# Patient Record
Sex: Female | Born: 1995 | Race: White | Hispanic: No | Marital: Married | Smoking: Never smoker
Health system: Southern US, Community
[De-identification: ages and names within clinical notes are randomized; demographics above are authoritative.]

## PROBLEM LIST (undated history)

## (undated) DIAGNOSIS — Z8489 Family history of other specified conditions: Secondary | ICD-10-CM

## (undated) DIAGNOSIS — F419 Anxiety disorder, unspecified: Secondary | ICD-10-CM

## (undated) DIAGNOSIS — R05 Cough: Secondary | ICD-10-CM

## (undated) DIAGNOSIS — E119 Type 2 diabetes mellitus without complications: Secondary | ICD-10-CM

## (undated) DIAGNOSIS — S86819A Strain of other muscle(s) and tendon(s) at lower leg level, unspecified leg, initial encounter: Secondary | ICD-10-CM

## (undated) HISTORY — DX: Family history of other specified conditions: Z84.89

## (undated) HISTORY — PX: RHINOPLASTY: SUR1284

---
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
## 2013-01-24 ENCOUNTER — Emergency Department (HOSPITAL_BASED_OUTPATIENT_CLINIC_OR_DEPARTMENT_OTHER): Payer: BC Managed Care – PPO

## 2013-01-24 ENCOUNTER — Emergency Department (HOSPITAL_BASED_OUTPATIENT_CLINIC_OR_DEPARTMENT_OTHER)
Admission: EM | Admit: 2013-01-24 | Discharge: 2013-01-24 | Disposition: A | Discharge status: Home / Self Care | Payer: BC Managed Care – PPO | Attending: Emergency Medicine | Admitting: Emergency Medicine

## 2013-01-24 ENCOUNTER — Encounter (HOSPITAL_BASED_OUTPATIENT_CLINIC_OR_DEPARTMENT_OTHER): Payer: Self-pay | Admitting: Emergency Medicine

## 2013-01-24 DIAGNOSIS — Y9389 Activity, other specified: Secondary | ICD-10-CM | POA: Insufficient documentation

## 2013-01-24 DIAGNOSIS — S63509A Unspecified sprain of unspecified wrist, initial encounter: Secondary | ICD-10-CM | POA: Insufficient documentation

## 2013-01-24 DIAGNOSIS — F411 Generalized anxiety disorder: Secondary | ICD-10-CM | POA: Insufficient documentation

## 2013-01-24 DIAGNOSIS — Y92410 Unspecified street and highway as the place of occurrence of the external cause: Secondary | ICD-10-CM | POA: Insufficient documentation

## 2013-01-24 DIAGNOSIS — S63502A Unspecified sprain of left wrist, initial encounter: Secondary | ICD-10-CM

## 2013-01-24 DIAGNOSIS — Z79899 Other long term (current) drug therapy: Secondary | ICD-10-CM | POA: Insufficient documentation

## 2013-01-24 DIAGNOSIS — IMO0002 Reserved for concepts with insufficient information to code with codable children: Secondary | ICD-10-CM | POA: Insufficient documentation
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
## 2017-01-03 ENCOUNTER — Other Ambulatory Visit: Payer: Self-pay | Admitting: Obstetrics and Gynecology

## 2017-01-03 DIAGNOSIS — N6311 Unspecified lump in the right breast, upper outer quadrant: Secondary | ICD-10-CM

## 2017-01-04 ENCOUNTER — Ambulatory Visit
Admission: RE | Admit: 2017-01-04 | Discharge: 2017-01-04 | Disposition: A | Discharge status: Home / Self Care | Payer: BLUE CROSS/BLUE SHIELD | Source: Ambulatory Visit | Attending: Obstetrics and Gynecology | Admitting: Obstetrics and Gynecology

## 2017-01-04 ENCOUNTER — Other Ambulatory Visit: Payer: Self-pay | Admitting: Obstetrics and Gynecology

## 2017-01-04 DIAGNOSIS — N6311 Unspecified lump in the right breast, upper outer quadrant: Secondary | ICD-10-CM

## 2017-07-04 ENCOUNTER — Ambulatory Visit
Admission: RE | Admit: 2017-07-04 | Discharge: 2017-07-04 | Disposition: A | Discharge status: Home / Self Care | Payer: BLUE CROSS/BLUE SHIELD | Source: Ambulatory Visit | Attending: Obstetrics and Gynecology | Admitting: Obstetrics and Gynecology

## 2017-07-04 ENCOUNTER — Other Ambulatory Visit: Payer: Self-pay | Admitting: Obstetrics and Gynecology

## 2017-07-04 DIAGNOSIS — N6311 Unspecified lump in the right breast, upper outer quadrant: Secondary | ICD-10-CM

## 2017-07-04 DIAGNOSIS — N6310 Unspecified lump in the right breast, unspecified quadrant: Secondary | ICD-10-CM

## 2018-01-05 ENCOUNTER — Other Ambulatory Visit: Payer: BLUE CROSS/BLUE SHIELD

## 2018-01-30 ENCOUNTER — Inpatient Hospital Stay: Admission: RE | Admit: 2018-01-30 | Payer: BLUE CROSS/BLUE SHIELD | Source: Ambulatory Visit

## 2018-02-07 ENCOUNTER — Ambulatory Visit
Admission: RE | Admit: 2018-02-07 | Discharge: 2018-02-07 | Disposition: A | Discharge status: Home / Self Care | Payer: BLUE CROSS/BLUE SHIELD | Source: Ambulatory Visit | Attending: Obstetrics and Gynecology | Admitting: Obstetrics and Gynecology

## 2018-02-07 DIAGNOSIS — N6310 Unspecified lump in the right breast, unspecified quadrant: Secondary | ICD-10-CM

## 2018-03-01 DIAGNOSIS — O139 Gestational [pregnancy-induced] hypertension without significant proteinuria, unspecified trimester: Secondary | ICD-10-CM

## 2018-03-01 HISTORY — DX: Gestational (pregnancy-induced) hypertension without significant proteinuria, unspecified trimester: O13.9

## 2018-03-01 NOTE — L&D Delivery Note (Signed)
Operative Delivery Note At 3:50 PM a viable female was delivered via Vaginal, [facility] Vacuum Neurosurgeon).  Presentation: vertex; Position: Left,, Occiput,, Anterior; Station: +3.  Head delivered after 3 push/pull efforts with each of five contractions; movement noted with each effort.  One pop off.  Verbal consent: obtained from patient.  Risks and benefits discussed in detail.  Risks include, but are not limited to the risks of anesthesia, bleeding, infection, damage to maternal tissues, fetal cephalhematoma.  There is also the risk of inability to effect vaginal delivery of the head, or shoulder dystocia that cannot be resolved by established maneuvers, leading to the need for emergency cesarean section.  APGAR: 9, 9; weight pending.   Placenta status: S, I.   3V Cord with the following complications: none.  Cord pH: n/a  Anesthesia:  CLEA Instruments: Kiwi Episiotomy: None Lacerations: 2nd degree Suture Repair: 3.0 vicryl rapide Est. Blood Loss (mL):  400  Mom to postpartum.  Baby to Couplet care / Skin to Skin.  Linda Hedges 11/24/2018, 4:15 PM

## 2018-03-23 ENCOUNTER — Emergency Department (HOSPITAL_BASED_OUTPATIENT_CLINIC_OR_DEPARTMENT_OTHER)
Admission: EM | Admit: 2018-03-23 | Discharge: 2018-03-24 | Disposition: A | Discharge status: Home / Self Care | Payer: BLUE CROSS/BLUE SHIELD | Attending: Emergency Medicine | Admitting: Emergency Medicine

## 2018-03-23 ENCOUNTER — Other Ambulatory Visit: Payer: Self-pay

## 2018-03-23 ENCOUNTER — Encounter (HOSPITAL_BASED_OUTPATIENT_CLINIC_OR_DEPARTMENT_OTHER): Payer: Self-pay | Admitting: *Deleted

## 2018-03-23 DIAGNOSIS — K529 Noninfective gastroenteritis and colitis, unspecified: Secondary | ICD-10-CM | POA: Diagnosis not present

## 2018-03-23 DIAGNOSIS — O219 Vomiting of pregnancy, unspecified: Secondary | ICD-10-CM | POA: Diagnosis present

## 2018-03-23 DIAGNOSIS — O99611 Diseases of the digestive system complicating pregnancy, first trimester: Secondary | ICD-10-CM | POA: Diagnosis not present

## 2018-03-23 DIAGNOSIS — Z79899 Other long term (current) drug therapy: Secondary | ICD-10-CM | POA: Diagnosis not present

## 2018-03-23 DIAGNOSIS — Z3A01 Less than 8 weeks gestation of pregnancy: Secondary | ICD-10-CM | POA: Insufficient documentation

## 2018-03-23 LAB — URINALYSIS, ROUTINE W REFLEX MICROSCOPIC
BILIRUBIN URINE: NEGATIVE
Glucose, UA: NEGATIVE mg/dL
Hgb urine dipstick: NEGATIVE
Leukocytes, UA: NEGATIVE
NITRITE: NEGATIVE
PROTEIN: NEGATIVE mg/dL
SPECIFIC GRAVITY, URINE: 1.02 (ref 1.005–1.030)
pH: 6 (ref 5.0–8.0)

## 2018-03-23 NOTE — ED Triage Notes (Signed)
She is [redacted] weeks pregnant and having vomiting since 5pm. Lower back and abdominal pain. She has not had an [place] to verify intrauterine placement.

## 2018-03-23 NOTE — ED Notes (Signed)
[place] left xray at 10pm.

## 2018-03-24 LAB — COMPREHENSIVE METABOLIC PANEL
ALBUMIN: 4.5 g/dL (ref 3.5–5.0)
ALK PHOS: 52 U/L (ref 38–126)
ALT: 20 U/L (ref 0–44)
AST: 21 U/L (ref 15–41)
Anion gap: 10 (ref 5–15)
BUN: 21 mg/dL — AB (ref 6–20)
CALCIUM: 8.9 mg/dL (ref 8.9–10.3)
CHLORIDE: 101 mmol/L (ref 98–111)
CO2: 21 mmol/L — AB (ref 22–32)
CREATININE: 0.56 mg/dL (ref 0.44–1.00)
GFR calc Af Amer: >60 mL/min (ref >60)
GFR calc non Af Amer: >60 mL/min (ref >60)
GLUCOSE: 128 mg/dL — AB (ref 70–99)
Potassium: 3.6 mmol/L (ref 3.5–5.1)
SODIUM: 132 mmol/L — AB (ref 135–145)
Total Bilirubin: 1 mg/dL (ref 0.3–1.2)
Total Protein: 7.7 g/dL (ref 6.5–8.1)

## 2018-03-24 LAB — CBC WITH DIFFERENTIAL/PLATELET
Abs Immature Granulocytes: 0.04 10*3/uL (ref 0.00–0.07)
BASOS ABS: 0 10*3/uL (ref 0.0–0.1)
BASOS PCT: 0 %
EOS ABS: 0 10*3/uL (ref 0.0–0.5)
Eosinophils Relative: 0 %
HCT: 39.4 % (ref 36.0–46.0)
HEMOGLOBIN: 12.8 g/dL (ref 12.0–15.0)
Immature Granulocytes: 0 %
LYMPHS ABS: 0.7 10*3/uL (ref 0.7–4.0)
LYMPHS PCT: 5 %
MCH: 30.5 pg (ref 26.0–34.0)
MCHC: 32.5 g/dL (ref 30.0–36.0)
MCV: 94 fL (ref 80.0–100.0)
MONO ABS: 0.3 10*3/uL (ref 0.1–1.0)
Monocytes Relative: 2 %
NEUTROS ABS: 12.5 10*3/uL — AB (ref 1.7–7.7)
Neutrophils Relative %: 93 %
PLATELETS: 203 10*3/uL (ref 150–400)
RBC: 4.19 MIL/uL (ref 3.87–5.11)
RDW: 11.6 % (ref 11.5–15.5)
WBC: 13.5 10*3/uL — ABNORMAL HIGH (ref 4.0–10.5)
nRBC: 0 % (ref 0.0–0.2)

## 2018-03-24 LAB — LIPASE, BLOOD: Lipase: 27 U/L (ref 11–51)

## 2018-03-24 LAB — HCG, QUANTITATIVE, PREGNANCY: HCG, BETA CHAIN, QUANT, S: 2411 m[IU]/mL — AB (ref <5)

## 2018-03-24 LAB — ABO/RH: ABO/RH(D): O POS

## 2018-03-24 MED ORDER — METOCLOPRAMIDE HCL 5 MG/ML IJ SOLN
10.0000 mg | Freq: Once | INTRAMUSCULAR | Status: AC
  Administered 2018-03-24: 10 mg via INTRAVENOUS
  Filled 2018-03-24: qty 2

## 2018-03-24 MED ORDER — SODIUM CHLORIDE 0.9 % IV BOLUS
1000.0000 mL | Freq: Once | INTRAVENOUS | Status: AC
  Administered 2018-03-24: 1000 mL via INTRAVENOUS

## 2018-03-24 MED ORDER — METOCLOPRAMIDE HCL 10 MG PO TABS: 10.0000 mg | ORAL_TABLET | Freq: Four times a day (QID) | ORAL | 0 refills | Status: DC | PRN

## 2018-03-24 NOTE — ED Notes (Signed)
Pt tolerating ginger ale 

## 2018-03-24 NOTE — ED Provider Notes (Signed)
[facility] Provider Note: Ashley DellJ. Lane Rucker Pridgeon, MD, FACEP  CSN: [id number] MRN: [id number] ARRIVAL: 03/23/18 at 2212 ROOM: MH01/MH01   CHIEF COMPLAINT  Vomiting   HISTORY OF PRESENT ILLNESS  03/24/18 12:33 AM Ashley Carson is a 23 y.o. female who is about [redacted] weeks pregnant.  She has had nausea, vomiting since about 5 PM.  The vomiting is been more severe than the diarrhea.  She has had some generalized abdominal cramping which is not severe.  She is having some lower back pain as well.  She has tried drinking Gatorade without success.   Past Medical History:  Diagnosis Date  . Anxiety   . Cough 04/23/2011  . Patellar tendon rupture 04/2011   right    Past Surgical History:  Procedure Laterality Date  . KNEE ARTHROSCOPY  07/09/2010   right; open repair patella dislocation  . MEDIAL PATELLOFEMORAL LIGAMENT REPAIR  04/29/2011   Procedure: MEDIAL PATELLA FEMORAL LIGAMENT RECONSTRUCTION;  Surgeon: Loreta Aveaniel F Murphy, MD;  Location: [facility];  Service: Orthopedics;  Laterality: Right;  right knee arthroscopy with chondroplasty open medial patella femoral ligament repair    Family History  Problem Relation Age of Onset  . Hypertension Maternal Grandmother   . Anesthesia problems Mother        post-op N/V/syncope    Social History   Tobacco Use  . Smoking status: Never Smoker  . Smokeless tobacco: Never Used  Substance Use Topics  . Alcohol use: No  . Drug use: No    Prior to Admission medications   Medication Sig Start Date End Date Taking? Authorizing Provider  escitalopram (LEXAPRO) 10 MG tablet Take 10 mg by mouth daily.   Yes [provider]  metoCLOPramide (REGLAN) 10 MG tablet Take 1 tablet (10 mg total) by mouth every 6 (six) hours as needed for nausea or vomiting. 03/24/18   Dara Camargo, Jonny RuizJohn, MD    Allergies Patient has no known allergies.   REVIEW OF SYSTEMS  Negative except as noted here or in the History of Present  Illness.   PHYSICAL EXAMINATION  Initial Vital Signs Blood pressure 112/71, pulse 96, temperature 98.2 F (36.8 C), temperature source Oral, resp. rate 20, height 5\' 7"  (1.702 m), weight 64 kg, last menstrual period 02/18/2018, SpO2 98 %.  Examination General: Well-developed, well-nourished female in no acute distress; appearance consistent with age of record HENT: normocephalic; atraumatic Eyes: pupils equal, round and reactive to light; extraocular muscles intact Neck: supple Heart: regular rate and rhythm Lungs: clear to auscultation bilaterally Abdomen: soft; nondistended; mild diffuse tenderness; no masses or hepatosplenomegaly; bowel sounds present Extremities: No deformity; full range of motion; pulses normal Neurologic: Awake, alert and oriented; motor function intact in all extremities and symmetric; no facial droop Skin: Warm and dry Psychiatric: Normal mood and affect   RESULTS  Summary of this visit's results, reviewed by myself:   EKG Interpretation  Date/Time:    Ventricular Rate:    PR Interval:    QRS Duration:   QT Interval:    QTC Calculation:   R Axis:     Text Interpretation:        Laboratory Studies: Results for orders placed or performed during the hospital encounter of 03/23/18 (from the past 24 hour(s))  Urinalysis, Routine w reflex microscopic     Status: Abnormal   Collection Time: 03/23/18 10:25 PM  Result Value Ref Range   Color, Urine YELLOW YELLOW   APPearance CLEAR CLEAR   Specific  Gravity, Urine 1.020 1.005 - 1.030   pH 6.0 5.0 - 8.0   Glucose, UA NEGATIVE NEGATIVE mg/dL   Hgb urine dipstick NEGATIVE NEGATIVE   Bilirubin Urine NEGATIVE NEGATIVE   Ketones, ur >80 (A) NEGATIVE mg/dL   Protein, ur NEGATIVE NEGATIVE mg/dL   Nitrite NEGATIVE NEGATIVE   Leukocytes, UA NEGATIVE NEGATIVE  hCG, quantitative, pregnancy     Status: Abnormal   Collection Time: 03/23/18 11:57 PM  Result Value Ref Range   hCG, Beta Chain, Quant, S 2,411 (H)  <5 mIU/mL  Comprehensive metabolic panel     Status: Abnormal   Collection Time: 03/23/18 11:57 PM  Result Value Ref Range   Sodium 132 (L) 135 - 145 mmol/L   Potassium 3.6 3.5 - 5.1 mmol/L   Chloride 101 98 - 111 mmol/L   CO2 21 (L) 22 - 32 mmol/L   Glucose, Bld 128 (H) 70 - 99 mg/dL   BUN 21 (H) 6 - 20 mg/dL   Creatinine, Ser 9.37 0.44 - 1.00 mg/dL   Calcium 8.9 8.9 - 16.9 mg/dL   Total Protein 7.7 6.5 - 8.1 g/dL   Albumin 4.5 3.5 - 5.0 g/dL   AST 21 15 - 41 U/L   ALT 20 0 - 44 U/L   Alkaline Phosphatase 52 38 - 126 U/L   Total Bilirubin 1.0 0.3 - 1.2 mg/dL   GFR calc non Af Amer >60 >60 mL/min   GFR calc Af Amer >60 >60 mL/min   Anion gap 10 5 - 15  Lipase, blood     Status: None   Collection Time: 03/23/18 11:57 PM  Result Value Ref Range   Lipase 27 11 - 51 U/L  CBC with Differential     Status: Abnormal   Collection Time: 03/23/18 11:57 PM  Result Value Ref Range   WBC 13.5 (H) 4.0 - 10.5 K/uL   RBC 4.19 3.87 - 5.11 MIL/uL   Hemoglobin 12.8 12.0 - 15.0 g/dL   HCT 67.8 93.8 - 10.1 %   MCV 94.0 80.0 - 100.0 fL   MCH 30.5 26.0 - 34.0 pg   MCHC 32.5 30.0 - 36.0 g/dL   RDW 75.1 02.5 - 85.2 %   Platelets 203 150 - 400 K/uL   nRBC 0.0 0.0 - 0.2 %   Neutrophils Relative % 93 %   Neutro Abs 12.5 (H) 1.7 - 7.7 K/uL   Lymphocytes Relative 5 %   Lymphs Abs 0.7 0.7 - 4.0 K/uL   Monocytes Relative 2 %   Monocytes Absolute 0.3 0.1 - 1.0 K/uL   Eosinophils Relative 0 %   Eosinophils Absolute 0.0 0.0 - 0.5 K/uL   Basophils Relative 0 %   Basophils Absolute 0.0 0.0 - 0.1 K/uL   Immature Granulocytes 0 %   Abs Immature Granulocytes 0.04 0.00 - 0.07 K/uL  ABO/Rh     Status: None   Collection Time: 03/23/18 11:57 PM  Result Value Ref Range   ABO/RH(D) O POS    No rh immune globuloin      NOT A RH IMMUNE GLOBULIN CANDIDATE, PT RH POSITIVE Performed at [facility], [address], [place], [place] [address]    Imaging Studies: No results found.  ED COURSE and MDM   Nursing notes and initial vitals signs, including pulse oximetry, reviewed.  Vitals:   03/23/18 2221 03/23/18 2222 03/24/18 0044  BP:  112/71 109/71  Pulse:  96 79  Resp:  20 18  Temp:  98.2 F (36.8 C)   TempSrc:  Oral   SpO2:  98% 100%  Weight: 64 kg    Height: 5\' 7"  (1.702 m)     2:43 AM Patient feeling better after IV fluids and Reglan.  She is drinking fluids without emesis.  PROCEDURES    ED DIAGNOSES     ICD-10-CM   1. Gastroenteritis K52.9        Nyashia Raney, MD 03/24/18 [phone]

## 2018-04-19 LAB — OB RESULTS CONSOLE GC/CHLAMYDIA
Chlamydia: NEGATIVE
Gonorrhea: NEGATIVE

## 2018-04-19 LAB — OB RESULTS CONSOLE HIV ANTIBODY (ROUTINE TESTING): HIV: NONREACTIVE

## 2018-04-19 LAB — OB RESULTS CONSOLE RPR: RPR: NONREACTIVE

## 2018-04-19 LAB — OB RESULTS CONSOLE ABO/RH: RH Type: POSITIVE

## 2018-04-19 LAB — OB RESULTS CONSOLE HEPATITIS B SURFACE ANTIGEN: Hepatitis B Surface Ag: NEGATIVE

## 2018-04-19 LAB — OB RESULTS CONSOLE ANTIBODY SCREEN: Antibody Screen: NEGATIVE

## 2018-04-19 LAB — OB RESULTS CONSOLE RUBELLA ANTIBODY, IGM: Rubella: IMMUNE

## 2018-11-20 ENCOUNTER — Telehealth (HOSPITAL_COMMUNITY): Payer: Self-pay | Admitting: *Deleted

## 2018-11-20 ENCOUNTER — Encounter (HOSPITAL_COMMUNITY): Payer: Self-pay | Admitting: *Deleted

## 2018-11-20 NOTE — Telephone Encounter (Signed)
Preadmission screen  

## 2018-11-21 ENCOUNTER — Encounter (HOSPITAL_COMMUNITY): Payer: Self-pay | Admitting: *Deleted

## 2018-11-21 LAB — OB RESULTS CONSOLE GBS: GBS: POSITIVE

## 2018-11-22 ENCOUNTER — Other Ambulatory Visit (HOSPITAL_COMMUNITY)
Admission: RE | Admit: 2018-11-22 | Discharge: 2018-11-22 | Disposition: A | Discharge status: Home / Self Care | Payer: BC Managed Care – PPO | Source: Ambulatory Visit | Attending: Obstetrics and Gynecology | Admitting: Obstetrics and Gynecology

## 2018-11-22 ENCOUNTER — Other Ambulatory Visit: Payer: Self-pay

## 2018-11-22 DIAGNOSIS — Z01812 Encounter for preprocedural laboratory examination: Secondary | ICD-10-CM | POA: Insufficient documentation

## 2018-11-22 DIAGNOSIS — Z20828 Contact with and (suspected) exposure to other viral communicable diseases: Secondary | ICD-10-CM | POA: Insufficient documentation

## 2018-11-22 LAB — SARS CORONAVIRUS 2 (TAT 6-24 HRS): SARS Coronavirus 2: NEGATIVE

## 2018-11-22 NOTE — MAU Note (Signed)
Asymptomatic, swab collected. 

## 2018-11-24 ENCOUNTER — Inpatient Hospital Stay (HOSPITAL_COMMUNITY): Payer: BC Managed Care – PPO | Admitting: Anesthesiology

## 2018-11-24 ENCOUNTER — Encounter (HOSPITAL_COMMUNITY): Payer: Self-pay

## 2018-11-24 ENCOUNTER — Inpatient Hospital Stay (HOSPITAL_COMMUNITY): Payer: BC Managed Care – PPO

## 2018-11-24 ENCOUNTER — Inpatient Hospital Stay (HOSPITAL_COMMUNITY)
Admission: RE | Admit: 2018-11-24 | Discharge: 2018-11-25 | DRG: 807 | Disposition: A | Discharge status: Home / Self Care | Payer: BC Managed Care – PPO | Attending: Obstetrics & Gynecology | Admitting: Obstetrics & Gynecology

## 2018-11-24 ENCOUNTER — Other Ambulatory Visit: Payer: Self-pay

## 2018-11-24 DIAGNOSIS — Z3A39 39 weeks gestation of pregnancy: Secondary | ICD-10-CM

## 2018-11-24 DIAGNOSIS — O26893 Other specified pregnancy related conditions, third trimester: Secondary | ICD-10-CM | POA: Diagnosis present

## 2018-11-24 DIAGNOSIS — O99344 Other mental disorders complicating childbirth: Secondary | ICD-10-CM | POA: Diagnosis present

## 2018-11-24 DIAGNOSIS — F419 Anxiety disorder, unspecified: Secondary | ICD-10-CM | POA: Diagnosis present

## 2018-11-24 DIAGNOSIS — Z3490 Encounter for supervision of normal pregnancy, unspecified, unspecified trimester: Secondary | ICD-10-CM

## 2018-11-24 DIAGNOSIS — O99824 Streptococcus B carrier state complicating childbirth: Principal | ICD-10-CM | POA: Diagnosis present

## 2018-11-24 LAB — CBC
HCT: 37.1 % (ref 36.0–46.0)
Hemoglobin: 12.2 g/dL (ref 12.0–15.0)
MCH: 31.3 pg (ref 26.0–34.0)
MCHC: 32.9 g/dL (ref 30.0–36.0)
MCV: 95.1 fL (ref 80.0–100.0)
Platelets: 162 10*3/uL (ref 150–400)
RBC: 3.9 MIL/uL (ref 3.87–5.11)
RDW: 13.1 % (ref 11.5–15.5)
WBC: 12.3 10*3/uL — ABNORMAL HIGH (ref 4.0–10.5)
nRBC: 0 % (ref 0.0–0.2)

## 2018-11-24 LAB — PROTEIN / CREATININE RATIO, URINE
Creatinine, Urine: 99.45 mg/dL
Protein Creatinine Ratio: 0.14 mg/mg{Cre} (ref 0.00–0.15)
Total Protein, Urine: 14 mg/dL

## 2018-11-24 LAB — TYPE AND SCREEN
ABO/RH(D): O POS
Antibody Screen: NEGATIVE

## 2018-11-24 LAB — RPR: RPR Ser Ql: NONREACTIVE

## 2018-11-24 LAB — ABO/RH: ABO/RH(D): O POS

## 2018-11-24 MED ORDER — FENTANYL CITRATE (PF) 100 MCG/2ML IJ SOLN: 50.0000 ug | INTRAMUSCULAR | Status: DC | PRN

## 2018-11-24 MED ORDER — ONDANSETRON HCL 4 MG PO TABS: 4.0000 mg | ORAL_TABLET | ORAL | Status: DC | PRN

## 2018-11-24 MED ORDER — COCONUT OIL OIL: 1.0000 "application " | TOPICAL_OIL | Status: DC | PRN

## 2018-11-24 MED ORDER — BENZOCAINE-MENTHOL 20-0.5 % EX AERO: 1.0000 "application " | INHALATION_SPRAY | CUTANEOUS | Status: DC | PRN

## 2018-11-24 MED ORDER — DIPHENHYDRAMINE HCL 50 MG/ML IJ SOLN: 12.5000 mg | INTRAMUSCULAR | Status: DC | PRN

## 2018-11-24 MED ORDER — PHENYLEPHRINE 40 MCG/ML (10ML) SYRINGE FOR IV PUSH (FOR BLOOD PRESSURE SUPPORT): 80.0000 ug | PREFILLED_SYRINGE | INTRAVENOUS | Status: DC | PRN

## 2018-11-24 MED ORDER — ONDANSETRON HCL 4 MG/2ML IJ SOLN: 4.0000 mg | INTRAMUSCULAR | Status: DC | PRN

## 2018-11-24 MED ORDER — LACTATED RINGERS IV SOLN
500.0000 mL | INTRAVENOUS | Status: DC | PRN
  Administered 2018-11-24: 09:00:00 500 mL via INTRAVENOUS

## 2018-11-24 MED ORDER — SIMETHICONE 80 MG PO CHEW: 80.0000 mg | CHEWABLE_TABLET | ORAL | Status: DC | PRN

## 2018-11-24 MED ORDER — LACTATED RINGERS IV SOLN
500.0000 mL | Freq: Once | INTRAVENOUS | Status: AC
  Administered 2018-11-24: 500 mL via INTRAVENOUS

## 2018-11-24 MED ORDER — SOD CITRATE-CITRIC ACID 500-334 MG/5ML PO SOLN: 30.0000 mL | ORAL | Status: DC | PRN

## 2018-11-24 MED ORDER — FENTANYL-BUPIVACAINE-NACL 0.5-0.125-0.9 MG/250ML-% EP SOLN: 12.0000 mL/h | EPIDURAL | Status: DC | PRN

## 2018-11-24 MED ORDER — OXYCODONE-ACETAMINOPHEN 5-325 MG PO TABS: 2.0000 | ORAL_TABLET | ORAL | Status: DC | PRN

## 2018-11-24 MED ORDER — ACETAMINOPHEN 325 MG PO TABS
650.0000 mg | ORAL_TABLET | ORAL | Status: DC | PRN
  Administered 2018-11-25: 650 mg via ORAL
  Filled 2018-11-24: qty 2

## 2018-11-24 MED ORDER — OXYCODONE-ACETAMINOPHEN 5-325 MG PO TABS: 1.0000 | ORAL_TABLET | ORAL | Status: DC | PRN

## 2018-11-24 MED ORDER — OXYTOCIN 40 UNITS IN NORMAL SALINE INFUSION - SIMPLE MED
2.5000 [IU]/h | INTRAVENOUS | Status: DC
  Administered 2018-11-24: 16:00:00 2.5 [IU]/h via INTRAVENOUS
  Filled 2018-11-24: qty 1000

## 2018-11-24 MED ORDER — PHENYLEPHRINE 40 MCG/ML (10ML) SYRINGE FOR IV PUSH (FOR BLOOD PRESSURE SUPPORT)
80.0000 ug | PREFILLED_SYRINGE | INTRAVENOUS | Status: DC | PRN
  Filled 2018-11-24: qty 10

## 2018-11-24 MED ORDER — EPHEDRINE 5 MG/ML INJ: 10.0000 mg | INTRAVENOUS | Status: DC | PRN

## 2018-11-24 MED ORDER — PRENATAL MULTIVITAMIN CH
1.0000 | ORAL_TABLET | Freq: Every day | ORAL | Status: DC
  Administered 2018-11-25: 1 via ORAL
  Filled 2018-11-24: qty 1

## 2018-11-24 MED ORDER — DIPHENHYDRAMINE HCL 25 MG PO CAPS: 25.0000 mg | ORAL_CAPSULE | Freq: Four times a day (QID) | ORAL | Status: DC | PRN

## 2018-11-24 MED ORDER — ACETAMINOPHEN 325 MG PO TABS: 650.0000 mg | ORAL_TABLET | ORAL | Status: DC | PRN

## 2018-11-24 MED ORDER — LIDOCAINE HCL (PF) 1 % IJ SOLN: 30.0000 mL | INTRAMUSCULAR | Status: DC | PRN

## 2018-11-24 MED ORDER — PHENYLEPHRINE 40 MCG/ML (10ML) SYRINGE FOR IV PUSH (FOR BLOOD PRESSURE SUPPORT)
80.0000 ug | PREFILLED_SYRINGE | Freq: Once | INTRAVENOUS | Status: AC
  Administered 2018-11-24: 80 ug via INTRAVENOUS

## 2018-11-24 MED ORDER — OXYTOCIN BOLUS FROM INFUSION
500.0000 mL | Freq: Once | INTRAVENOUS | Status: AC
  Administered 2018-11-24: 500 mL via INTRAVENOUS

## 2018-11-24 MED ORDER — DIBUCAINE (PERIANAL) 1 % EX OINT: 1.0000 "application " | TOPICAL_OINTMENT | CUTANEOUS | Status: DC | PRN

## 2018-11-24 MED ORDER — TETANUS-DIPHTH-ACELL PERTUSSIS 5-2.5-18.5 LF-MCG/0.5 IM SUSP: 0.5000 mL | Freq: Once | INTRAMUSCULAR | Status: DC

## 2018-11-24 MED ORDER — WITCH HAZEL-GLYCERIN EX PADS: 1.0000 "application " | MEDICATED_PAD | CUTANEOUS | Status: DC | PRN

## 2018-11-24 MED ORDER — LIDOCAINE HCL (PF) 1 % IJ SOLN
INTRAMUSCULAR | Status: DC | PRN
  Administered 2018-11-24: 10 mL via EPIDURAL

## 2018-11-24 MED ORDER — TERBUTALINE SULFATE 1 MG/ML IJ SOLN: 0.2500 mg | Freq: Once | INTRAMUSCULAR | Status: DC | PRN

## 2018-11-24 MED ORDER — SODIUM CHLORIDE 0.9 % IV SOLN
5.0000 10*6.[IU] | Freq: Once | INTRAVENOUS | Status: AC
  Administered 2018-11-24: 08:00:00 5 10*6.[IU] via INTRAVENOUS
  Filled 2018-11-24: qty 5

## 2018-11-24 MED ORDER — ESCITALOPRAM OXALATE 20 MG PO TABS
20.0000 mg | ORAL_TABLET | Freq: Every day | ORAL | Status: DC
  Administered 2018-11-24 – 2018-11-25 (×2): 20 mg via ORAL
  Filled 2018-11-24 (×2): qty 1

## 2018-11-24 MED ORDER — LACTATED RINGERS IV SOLN: 500.0000 mL | Freq: Once | INTRAVENOUS | Status: DC

## 2018-11-24 MED ORDER — FENTANYL-BUPIVACAINE-NACL 0.5-0.125-0.9 MG/250ML-% EP SOLN
12.0000 mL/h | EPIDURAL | Status: DC | PRN
  Filled 2018-11-24: qty 250

## 2018-11-24 MED ORDER — IBUPROFEN 600 MG PO TABS
600.0000 mg | ORAL_TABLET | Freq: Four times a day (QID) | ORAL | Status: DC
  Administered 2018-11-24 – 2018-11-25 (×5): 600 mg via ORAL
  Filled 2018-11-24 (×5): qty 1

## 2018-11-24 MED ORDER — SODIUM CHLORIDE (PF) 0.9 % IJ SOLN
INTRAMUSCULAR | Status: DC | PRN
  Administered 2018-11-24: 12 mL/h via EPIDURAL

## 2018-11-24 MED ORDER — PENICILLIN G 3 MILLION UNITS IVPB - SIMPLE MED
3.0000 10*6.[IU] | INTRAVENOUS | Status: DC
  Administered 2018-11-24: 3 10*6.[IU] via INTRAVENOUS
  Filled 2018-11-24: qty 100

## 2018-11-24 MED ORDER — INFLUENZA VAC SPLIT QUAD 0.5 ML IM SUSY: 0.5000 mL | PREFILLED_SYRINGE | INTRAMUSCULAR | Status: DC

## 2018-11-24 MED ORDER — ONDANSETRON HCL 4 MG/2ML IJ SOLN: 4.0000 mg | Freq: Four times a day (QID) | INTRAMUSCULAR | Status: DC | PRN

## 2018-11-24 MED ORDER — DOCUSATE SODIUM 100 MG PO CAPS
100.0000 mg | ORAL_CAPSULE | Freq: Two times a day (BID) | ORAL | Status: DC
  Administered 2018-11-24 – 2018-11-25 (×2): 100 mg via ORAL
  Filled 2018-11-24 (×2): qty 1

## 2018-11-24 MED ORDER — ZOLPIDEM TARTRATE 5 MG PO TABS: 5.0000 mg | ORAL_TABLET | Freq: Every evening | ORAL | Status: DC | PRN

## 2018-11-24 MED ORDER — SENNOSIDES-DOCUSATE SODIUM 8.6-50 MG PO TABS
2.0000 | ORAL_TABLET | ORAL | Status: DC
  Administered 2018-11-24: 23:00:00 2 via ORAL
  Filled 2018-11-24: qty 2

## 2018-11-24 MED ORDER — OXYTOCIN 40 UNITS IN NORMAL SALINE INFUSION - SIMPLE MED
1.0000 m[IU]/min | INTRAVENOUS | Status: DC
  Administered 2018-11-24: 2 m[IU]/min via INTRAVENOUS
  Administered 2018-11-24: 10:00:00 4 m[IU]/min via INTRAVENOUS

## 2018-11-24 MED ORDER — LACTATED RINGERS IV SOLN
INTRAVENOUS | Status: DC
  Administered 2018-11-24 (×3): via INTRAVENOUS

## 2018-11-24 NOTE — Anesthesia Preprocedure Evaluation (Addendum)

## 2018-11-24 NOTE — H&P (Signed)
Ashley Carson is a 23 y.o. female G1 at [redacted]w[redacted]d presenting for labor.  CTX woke her up around 0130 and have continue to intensity.  No LOF or VB.  Active FM.  Patient has anxiety which has been well controlled this pregnancy with Lexapro.  She is GBS positive.  Requesting CLEA.  OB History    Gravida  1   Para      Term      Preterm      AB      Living        SAB      TAB      Ectopic      Multiple      Live Births             Past Medical History:  Diagnosis Date  . Anxiety   . Cough 04/23/2011  . Patellar tendon rupture 04/2011   right   Past Surgical History:  Procedure Laterality Date  . KNEE ARTHROSCOPY  07/09/2010   right; open repair patella dislocation  . MEDIAL PATELLOFEMORAL LIGAMENT REPAIR  04/29/2011   Procedure: MEDIAL PATELLA FEMORAL LIGAMENT RECONSTRUCTION;  Surgeon: Loreta Ave, MD;  Location: [facility];  Service: Orthopedics;  Laterality: Right;  right knee arthroscopy with chondroplasty open medial patella femoral ligament repair  . RHINOPLASTY     Family History: family history includes Anesthesia problems in her mother; Hypertension in her maternal grandmother. Social History:  reports that she has never smoked. She has never used smokeless tobacco. She reports that she does not drink alcohol or use drugs.     Maternal Diabetes: No Genetic Screening: Normal Maternal Ultrasounds/Referrals: Normal Fetal Ultrasounds or other Referrals:  None Maternal Substance Abuse:  No Significant Maternal Medications:  Meds include: Other: Lexapro Significant Maternal Lab Results:  Group B Strep positive Other Comments:  None  ROS Maternal Medical History:  Reason for admission: Contractions.   Contractions: Onset was 6-12 hours ago.   Frequency: regular.   Perceived severity is moderate.    Fetal activity: Perceived fetal activity is normal.   Last perceived fetal movement was within the past hour.    Prenatal  complications: no prenatal complications Prenatal Complications - Diabetes: none.    Dilation: 4(4-4.5) Effacement (%): 70 Station: -3, -2 Exam by:: Adah Perl RN Blood pressure 140/80, pulse 79, temperature 98.2 F (36.8 C), temperature source Oral, resp. rate 20, height 5\' 7"  (1.702 m), weight 84.8 kg, last menstrual period 02/18/2018, SpO2 100 %. Maternal Exam:  Uterine Assessment: Contraction strength is moderate.  Contraction frequency is regular.   Abdomen: Patient reports no abdominal tenderness. Fundal height is c/w dates.   Estimated fetal weight is 7#8.       Fetal Exam Fetal Monitor Review: Baseline rate: 130.  Variability: moderate (6-25 bpm).   Pattern: accelerations present and no decelerations.    Fetal State Assessment: Category I - tracings are normal.     Physical Exam  Constitutional: She is oriented to person, place, and time. She appears well-developed and well-nourished.  GI: Soft. There is no rebound and no guarding.  Neurological: She is alert and oriented to person, place, and time.  Skin: Skin is warm and dry.  Psychiatric: She has a normal mood and affect. Her behavior is normal.    Prenatal labs: ABO, Rh: O/Positive/-- (02/19 0000) Antibody: Negative (02/19 0000) Rubella: Immune (02/19 0000) RPR: Nonreactive (02/19 0000)  HBsAg: Negative (02/19 0000)  HIV: Non-reactive (02/19 0000)  GBS: Positive/-- (09/22 0000)   Assessment/Plan: 23yo G1 at [redacted]w[redacted]d with SOL -CLEA -Augment prn -PCN for GBS ppx -Anticipate NSVD   Linda Hedges 11/24/2018, 8:17 AM

## 2018-11-24 NOTE — Anesthesia Procedure Notes (Signed)
Epidural Patient location during procedure: OB Start time: 11/24/2018 8:25 AM End time: 11/24/2018 8:39 AM  Staffing Anesthesiologist: Lynda Rainwater, MD Performed: anesthesiologist   Preanesthetic Checklist Completed: patient identified, site marked, surgical consent, pre-op evaluation, timeout performed, IV checked, risks and benefits discussed and monitors and equipment checked  Epidural Patient position: sitting Prep: ChloraPrep Patient monitoring: heart rate, cardiac monitor, continuous pulse ox and blood pressure Approach: midline Location: L2-L3 Injection technique: LOR saline  Needle:  Needle type: Tuohy  Needle gauge: 17 G Needle length: 9 cm Needle insertion depth: 6 cm Catheter type: closed end flexible Catheter size: 20 Guage Catheter at skin depth: 10 cm Test dose: negative  Assessment Events: blood not aspirated, injection not painful, no injection resistance, negative IV test and no paresthesia  Additional Notes Reason for block:procedure for pain

## 2018-11-24 NOTE — Lactation Note (Signed)
This note was copied from a baby's chart. Lactation Consultation Note  Patient Name: Boy Onesty Clair [id number] Date: 11/24/2018 Reason for consult: Initial assessment;Primapara;1st time breastfeeding;Term  2128 - 2147 - I conducted an initial consult with Ms. Hata. She was holding baby [place] skin to skin and preparing to breast feed upon entry. He is five hours old, and she reports good breast feeding thus far. She asked for a feeding assessment just to verify latch.  I first observed and then offered to help. I showed Ms. Stegemann how to place baby in cross cradle hold and how to stimulate baby's filtrum for a deeper latch. He opened wide and latched with rhythmic suckling sequences, and after a few suckles he would slide down the nipple to a more shallow latch.  I showed Ms. Jordan how to break suction when needed, and how to re-latch baby.  Baby fed about 7 minutes and fell asleep at the breast.  I educated on day 1 feeding patterns and breast feeding basics.  I recommended that Ms. Craghead breast feed 8-12 times a day on demand and wake to feed as needed.  Ms. Ohmer has a personal pump and she also took a prenatal breast feeding class online. Her affect was relaxed, and she was very attentive to baby. I praised her for holding baby skin to skin and for breast feeding on demand.   Maternal Data Formula Feeding for Exclusion: No Has patient been taught Hand Expression?: Yes Does the patient have breastfeeding experience prior to this delivery?: No  Feeding Feeding Type: Breast Fed  LATCH Score Latch: Grasps breast easily, tongue down, lips flanged, rhythmical sucking.  Audible Swallowing: A few with stimulation  Type of Nipple: Everted at rest and after stimulation  Comfort (Breast/Nipple): Soft / non-tender  Hold (Positioning): Assistance needed to correctly position infant at breast and maintain latch.  LATCH Score: 8  Interventions Interventions: Breast feeding basics  reviewed;Assisted with latch;Skin to skin;Hand express;Breast massage;Breast compression;Adjust position;Support pillows  Lactation Tools Discussed/Used WIC Program: No   Consult Status Consult Status: Follow-up Date: 11/25/18 Follow-up type: In-patient    Lenore Manner 11/24/2018, 9:55 PM

## 2018-11-25 LAB — CBC
HCT: 32.9 % — ABNORMAL LOW (ref 36.0–46.0)
Hemoglobin: 11 g/dL — ABNORMAL LOW (ref 12.0–15.0)
MCH: 32.1 pg (ref 26.0–34.0)
MCHC: 33.4 g/dL (ref 30.0–36.0)
MCV: 95.9 fL (ref 80.0–100.0)
Platelets: 141 10*3/uL — ABNORMAL LOW (ref 150–400)
RBC: 3.43 MIL/uL — ABNORMAL LOW (ref 3.87–5.11)
RDW: 13.1 % (ref 11.5–15.5)
WBC: 13.5 10*3/uL — ABNORMAL HIGH (ref 4.0–10.5)
nRBC: 0 % (ref 0.0–0.2)

## 2018-11-25 MED ORDER — IBUPROFEN 600 MG PO TABS: 600.0000 mg | ORAL_TABLET | Freq: Four times a day (QID) | ORAL | 0 refills | Status: DC

## 2018-11-25 NOTE — Discharge Summary (Signed)
Obstetric Discharge Summary Reason for Admission: onset of labor Prenatal Procedures: none Intrapartum Procedures: spontaneous vaginal delivery and vacuum Postpartum Procedures: none Complications-Operative and Postpartum: 2nd degree perineal laceration Hemoglobin  Date Value Ref Range Status  11/25/2018 11.0 (L) 12.0 - 15.0 g/dL Final   HCT  Date Value Ref Range Status  11/25/2018 32.9 (L) 36.0 - 46.0 % Final    Physical Exam:  General: alert, cooperative and appears stated age 23: appropriate Uterine Fundus: firm Incision: healing well, no significant drainage, no dehiscence DVT Evaluation: No evidence of DVT seen on physical exam. Negative Homan's sign. No cords or calf tenderness.  Discharge Diagnoses: Term Pregnancy-delivered  Discharge Information: Date: 11/25/2018 Activity: pelvic rest Diet: routine Medications: PNV and Ibuprofen, Lexapro Condition: stable Instructions: refer to practice specific booklet Discharge to: home   Newborn Data: Live born female  Birth Weight: 9 lb 2.7 oz (4160 g) APGAR: 9, 9  Newborn Delivery   Birth date/time: 11/24/2018 15:50:00 Delivery type: Vaginal, Vacuum (Extractor)      Home with mother.  Linda Hedges 11/25/2018, 3:33 PM

## 2018-11-25 NOTE — Progress Notes (Signed)
Post Partum Day 1 Subjective: no complaints, up ad lib, voiding and tolerating PO.  Patient requests circ.  Objective: Blood pressure (!) 133/94, pulse 69, temperature 98.4 F (36.9 C), temperature source Oral, resp. rate 18, height 5\' 7"  (1.702 m), weight 84.8 kg, last menstrual period 02/18/2018, SpO2 100 %, unknown if currently breastfeeding.  Physical Exam:  General: alert, cooperative and appears stated age Lochia: appropriate Uterine Fundus: firm Incision: healing well, no significant drainage, no dehiscence DVT Evaluation: No evidence of DVT seen on physical exam. Negative Homan's sign. No cords or calf tenderness.  Recent Labs    11/24/18 0720 11/25/18 0444  HGB 12.2 11.0*  HCT 37.1 32.9*    Assessment/Plan: Plan for discharge tomorrow, Breastfeeding and Circumcision prior to discharge  Patient is counseled for circ including risk of bleeding, infection, and scarring.  All questions were answered.     LOS: 1 day   Linda Hedges 11/25/2018, 8:42 AM

## 2018-11-25 NOTE — Lactation Note (Signed)
This note was copied from a baby's chart. Lactation Consultation Note  Patient Name: Ashley Carson [id number] Date: 11/25/2018   RN Izora Gala requesedt to visit mom to check on her, she's a P1 and reported that wanted to see lactation because baby was spitting colostrum. Mom was also wondering why baby hasn't been having "longer feedings" as yesterday. Mom was on Lexapro an L2 during the pregnancy. Reassured mom that if baby is spitting colostrum is because he's transferring and that she should continue spoon feeding him as she's been doing to make sure he's getting his calories. Discussed cluster feeding and also the need to start burping baby especially after every feeding.   LC offered assistance with latch but mom said baby already fed about 20 minutes ago, RN Izora Gala will update feeds; last one recorded was at 5:30 am. Mom requested LC to check on them later today when baby is ready to go to breast, LC let mom know that if this [facility] can't stop by, [facility] will, she worked with them yesterday.   Feeding plan:  1. Encouraged parents to feed baby STS 8-12 times/24 hours or sooner if feeding cues are present 2. Hand expression and spoon feeding were also strongly encouraged, praised mom for her efforts  Parents reported all questions and concerns were answered, they're both aware of [name] services and will call PRN.   Maternal Data    Feeding    LATCH Score                   Interventions    Lactation Tools Discussed/Used     Consult Status      Saban Heinlen S Jair Lindblad 11/25/2018, 1:10 PM

## 2018-11-25 NOTE — Anesthesia Postprocedure Evaluation (Signed)
Anesthesia Post Note  Patient: Ashley Carson  Procedure(s) Performed: AN AD [place]     Patient location during evaluation: Mother Baby Anesthesia Type: Epidural Level of consciousness: awake and alert Pain management: pain level controlled Vital Signs Assessment: post-procedure vital signs reviewed and stable Respiratory status: spontaneous breathing, nonlabored ventilation and respiratory function stable Cardiovascular status: stable Postop Assessment: no headache, no backache and epidural receding Anesthetic complications: no    Last Vitals:  Vitals:   11/25/18 0226 11/25/18 0617  BP: 128/82 (!) 133/94  Pulse: (!) 57 69  Resp: 16 18  Temp: 36.7 C 36.9 C  SpO2: 97% 100%    Last Pain:  Vitals:   11/25/18 0617  TempSrc: Oral  PainSc:    Pain Goal:                   [employer]

## 2018-11-25 NOTE — Lactation Note (Signed)
This note was copied from a baby's chart. Lactation Consultation Note  Patient Name: Ashley Carson [id number] Date: 11/25/2018 Reason for consult: Follow-up assessment;Primapara;1st time breastfeeding;Term  1716 - 1737 - I followed up with Ashley Carson upon her request. She requested that I observe her latch baby one more time prior to discharge. Baby Ronney Lion was having his blood drawn, and he was upset. After the procedure, he refused to breast feed, and Ashley Carson held him skin to skin.  I conducted discharge education, including reviewed our community breast feeding resources, educating on how to manage engorgement, flange fit for her breast pump, when to pump, and milk storage guidelines.   Ashley Carson has several ounces of hand expressed colostrum at home. I provided her with a foley cup and instructions on how to cup feed should she desire to supplement baby a few mls following breast feeding sessions. We talked about indications for supplementation, and how to know if baby is getting enough to eat.  Ashley Carson is following up with [facility] in [place] on Monday. I encourage her to call [place] support at [facility] for any questions or concerns and to attend the breast feeding support.   Maternal Data Formula Feeding for Exclusion: No Has patient been taught Hand Expression?: Yes Does the patient have breastfeeding experience prior to this delivery?: No   Interventions Interventions: Breast feeding basics reviewed(flange fitting for personal pump; discharge education)   Consult Status Consult Status: Complete    Lenore Manner 11/25/2018, 5:50 PM

## 2018-11-25 NOTE — Discharge Instructions (Signed)
Call MD for T>100.4, heavy vaginal bleeding, severe abdominal pain or respiratory distress.  Call office to schedule postpartum visit in 6 weeks.  Pelvic rest x 6 weeks.   

## 2018-11-25 NOTE — Progress Notes (Signed)
MOB was referred for history of depression/anxiety. * Referral screened out by Clinical Social Worker because none of the following criteria appear to apply: ~ History of anxiety/depression during this pregnancy, or of post-partum depression following prior delivery. ~ Diagnosis of anxiety and/or depression within last 3 years OR * MOB's symptoms currently being treated with medication and/or therapy. Per further chart review, MOB on Lexapro for anxiety.    Please contact the Clinical Social Worker if needs arise, by MOB request, or if MOB scores greater than 9/yes to question 10 on Edinburgh Postpartum Depression Screen.   Mabell Esguerra S. Yashica Sterbenz, MSW, LCSW [facility] [phone]    

## 2018-11-25 NOTE — Progress Notes (Signed)
Patient feeling well and meeting all postpartum goals.  She requests d/c home today.  D/C orders placed.  Linda Hedges, DO

## 2018-11-26 ENCOUNTER — Ambulatory Visit: Payer: Self-pay

## 2018-11-26 NOTE — Lactation Note (Signed)
This note was copied from a baby's chart. Lactation Consultation Note  Patient Name: Ashley Carson [id number] Date: 11/26/2018 Reason for consult: Follow-up assessment  P1 mother whose infant is now 61 hours old.    Baby was asleep in the bassinet when I arrived.  Mother desired for me to observe a latch prior to discharge.  She stated that baby cluster fed last night.  Awakened baby and he aroused to a quiet awake state.  Mother's breasts are soft and non tender and nipples are everted and intact.  Observed mother attempting to latch but baby remained sleepy.  Assisted to burp and awaken again.  Assisted baby to latch in the cross cradle hole to the left breast.  However, he required constant stimulation to arouse and only a couple swallows were noted.  He fell asleep at the breast.  Reassured mother that it had only been 2 hours since his last feeding and if he cluster fed a lot during the night this caused him to be sleepy right now.  Suggested she observe for feeding cues and call me back when he is ready to feed.  Mother verbalized understanding.  She has a manual pump and a DEBP for home use.  Engorgement prevention/treatment reviewed yesterday.  Father present.   Maternal Data    Feeding    LATCH Score Latch: Too sleepy or reluctant, no latch achieved, no sucking elicited.  Audible Swallowing: A few with stimulation  Type of Nipple: Everted at rest and after stimulation  Comfort (Breast/Nipple): Soft / non-tender  Hold (Positioning): Assistance needed to correctly position infant at breast and maintain latch.  LATCH Score: 6  Interventions Interventions: Breast feeding basics reviewed;Assisted with latch;Skin to skin;Breast massage;Hand express;Breast compression;Hand pump;Position options;Support pillows;Adjust position  Lactation Tools Discussed/Used     Consult Status Consult Status: Complete Date: 11/26/18 Follow-up type: Call as needed    Toney Difatta R  Parilee Hally 11/26/2018, 8:20 AM

## 2018-12-29 ENCOUNTER — Other Ambulatory Visit: Payer: Self-pay | Admitting: Obstetrics and Gynecology

## 2018-12-29 DIAGNOSIS — N6310 Unspecified lump in the right breast, unspecified quadrant: Secondary | ICD-10-CM

## 2019-02-09 ENCOUNTER — Other Ambulatory Visit: Payer: BC Managed Care – PPO

## 2019-02-15 ENCOUNTER — Other Ambulatory Visit: Payer: Self-pay

## 2019-02-15 ENCOUNTER — Ambulatory Visit
Admission: RE | Admit: 2019-02-15 | Discharge: 2019-02-15 | Disposition: A | Discharge status: Home / Self Care | Payer: BC Managed Care – PPO | Source: Ambulatory Visit | Attending: Obstetrics and Gynecology | Admitting: Obstetrics and Gynecology

## 2019-02-15 DIAGNOSIS — N6310 Unspecified lump in the right breast, unspecified quadrant: Secondary | ICD-10-CM

## 2019-05-07 IMAGING — US ULTRASOUND RIGHT BREAST LIMITED
1 series · 6 of 6 positions shown · non-contrast
Comparison: None

CLINICAL DATA: Palpable lump in the right breast

EXAM:
ULTRASOUND OF THE RIGHT BREAST

[Series 1: ultrasound right breast limited · 0.05mm/px · 6 of 6 slices shown]
[im 1/6]
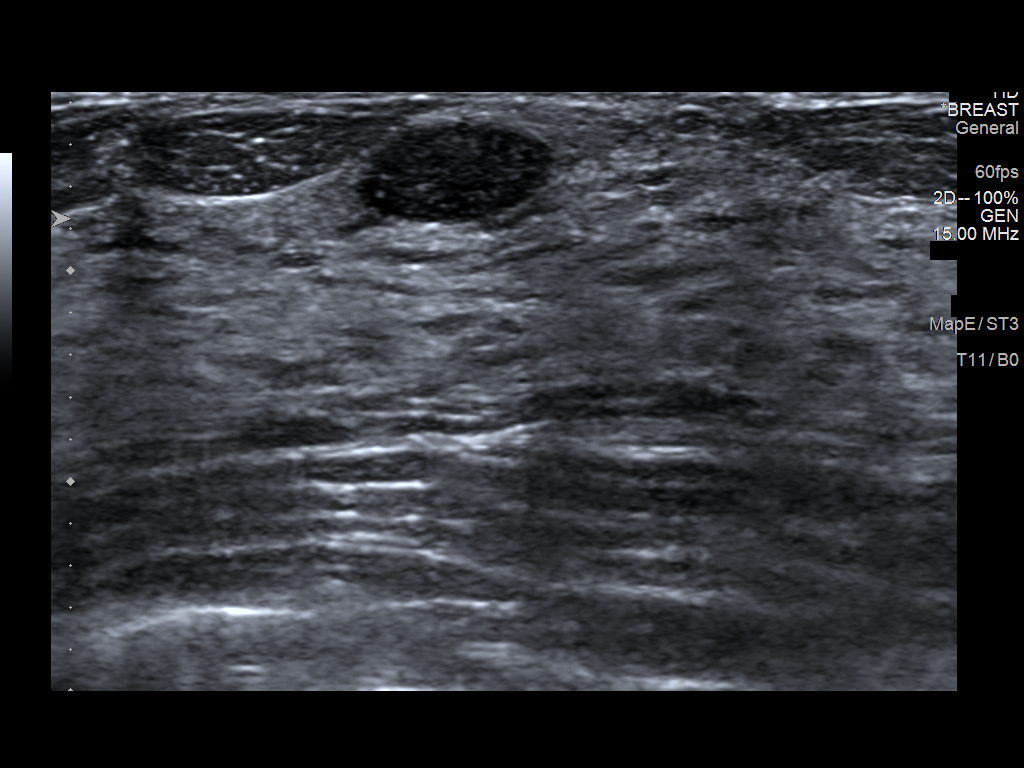
[im 2/6]
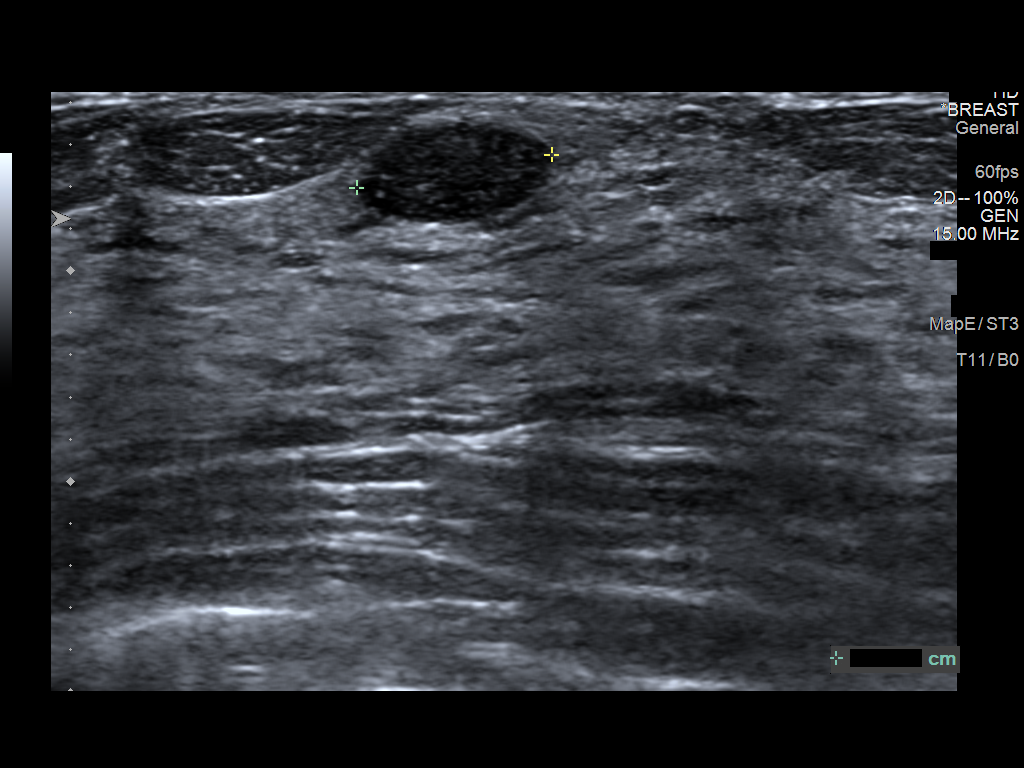
[im 3/6]
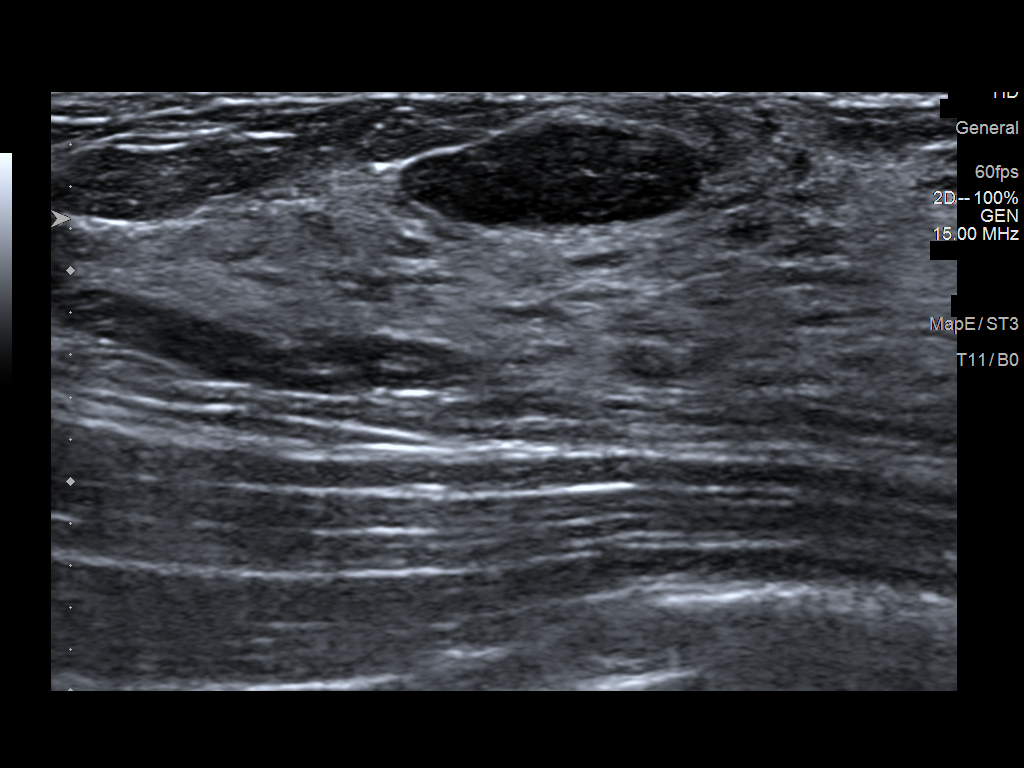
[im 4/6]
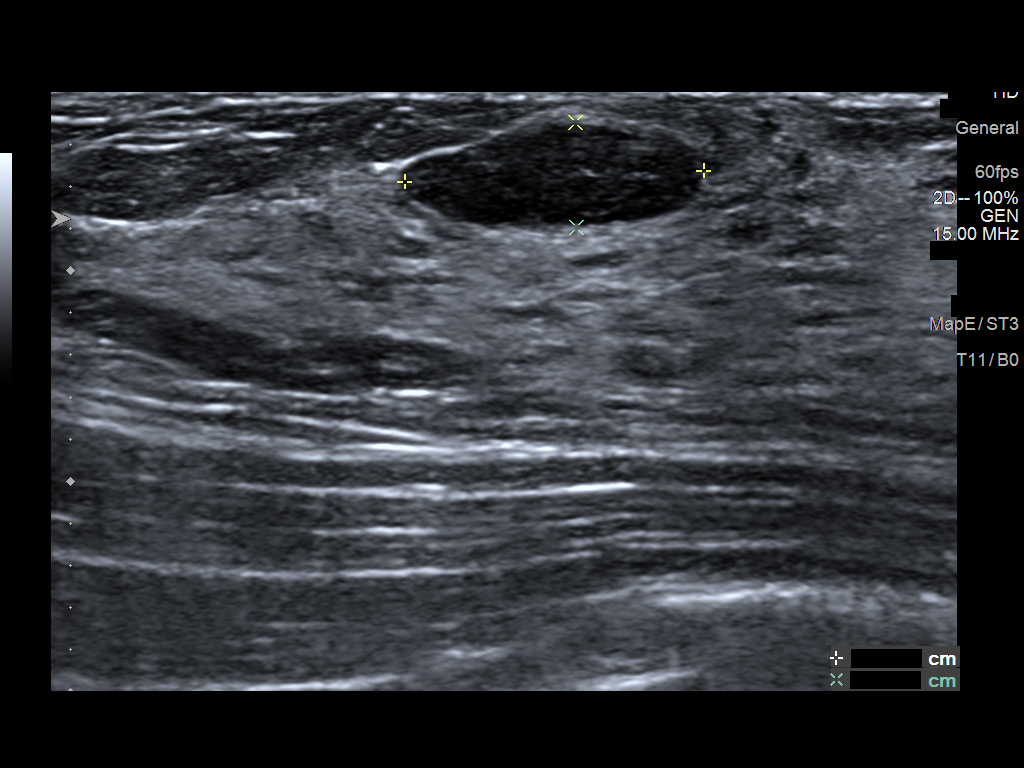
[im 5/6]
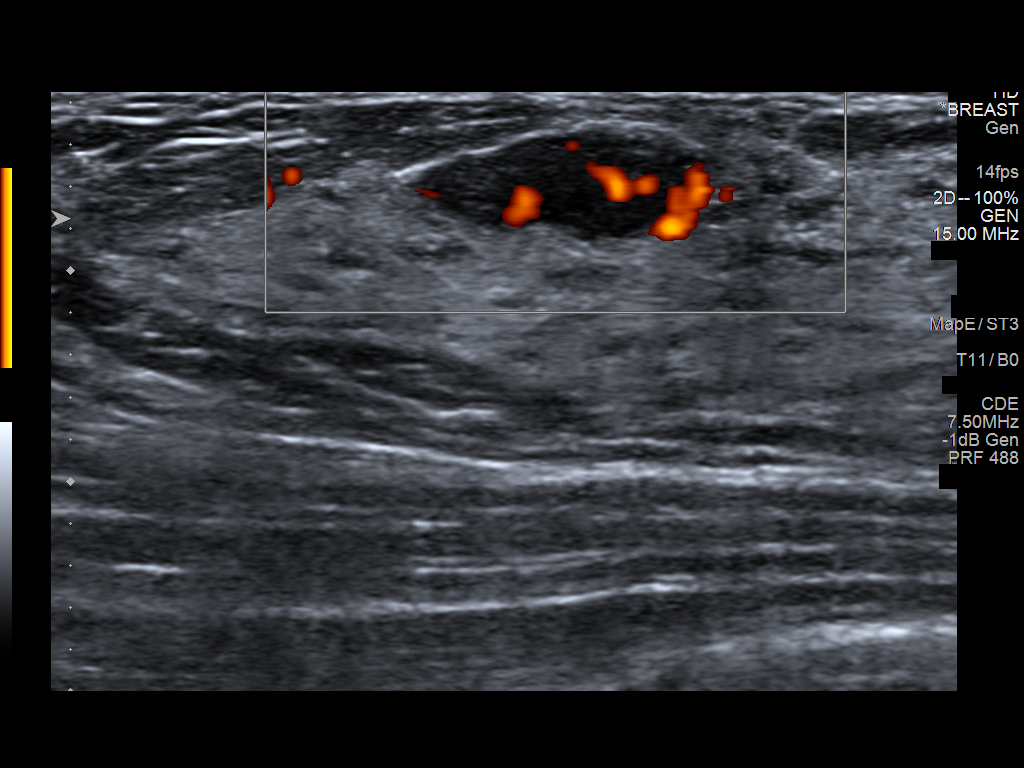
[im 6/6]
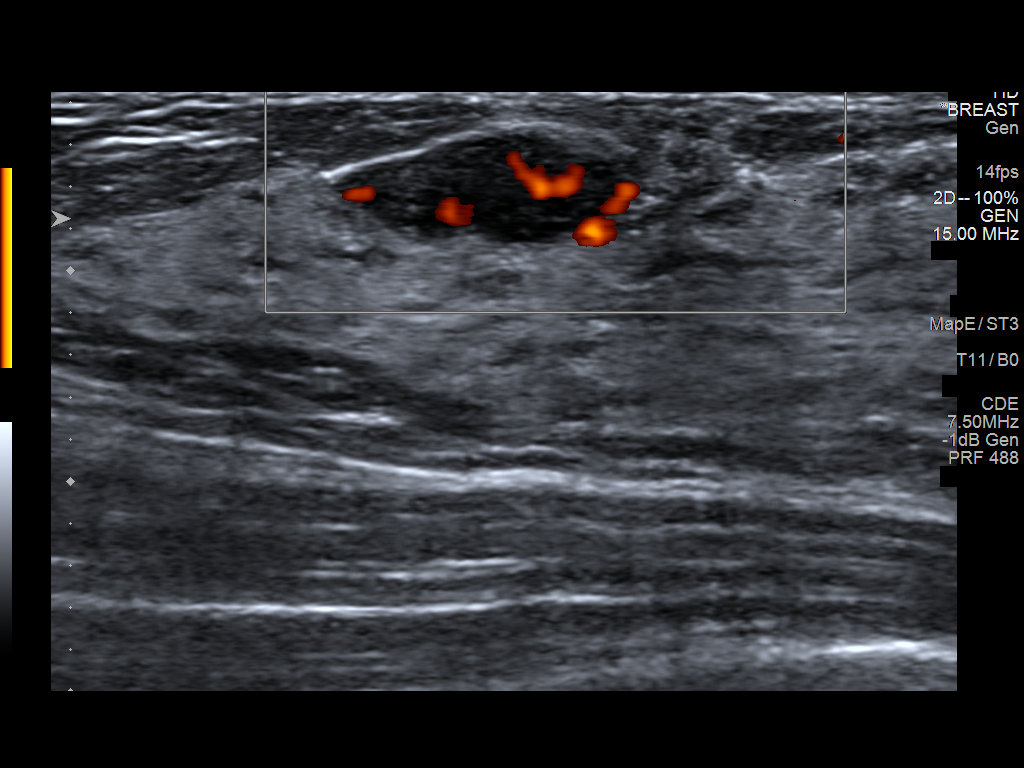

[6 of 6 positions shown; findings below may reference images not displayed]

FINDINGS: On physical exam, no suspicious lumps are identified.

Targeted ultrasound is performed, showing an oval circumscribed mass
with parallel orientation and increased through transmission
measuring 9 x 14 x 5 mm. No other suspicious findings.
IMPRESSION: Probably benign right breast mass, likely a fibroadenoma.

RECOMMENDATION:
Recommend six-month follow-up ultrasound to ensure stability of the
probably benign right breast mass.

I have discussed the findings and recommendations with the patient.
Results were also provided in writing at the conclusion of the
visit. If applicable, a reminder letter will be sent to the patient
regarding the next appointment.

BI-RADS CATEGORY  3: Probably benign.

## 2019-05-24 ENCOUNTER — Ambulatory Visit: Payer: BC Managed Care – PPO | Attending: Internal Medicine

## 2019-05-24 DIAGNOSIS — Z23 Encounter for immunization: Secondary | ICD-10-CM

## 2019-05-24 NOTE — Progress Notes (Signed)
   Covid-19 Vaccination Clinic  Name:  Ashley Carson    MRN: [id number] DOB: 09/25/1995  05/24/2019  Ms. Bilbo was observed post Covid-19 immunization for 15 minutes without incident. She was provided with Vaccine Information Sheet and instruction to access the V-Safe system.   Ms. Virgo was instructed to call 911 with any severe reactions post vaccine: Marland Kitchen Difficulty breathing  . Swelling of face and throat  . A fast heartbeat  . A bad rash all over body  . Dizziness and weakness   Immunizations Administered    Name Date Dose VIS Date Route   Pfizer COVID-19 Vaccine 05/24/2019  9:35 AM 0.3 mL 02/09/2019 Intramuscular   Manufacturer: [employer], [employer]   Lot: [id number]   NDC: [address]-8

## 2019-06-18 ENCOUNTER — Ambulatory Visit: Payer: BC Managed Care – PPO | Attending: Internal Medicine

## 2019-06-18 DIAGNOSIS — Z23 Encounter for immunization: Secondary | ICD-10-CM

## 2019-06-18 NOTE — Progress Notes (Signed)
   Covid-19 Vaccination Clinic  Name:  Tomeca Helm    MRN: [id number] DOB: [birth year]  06/18/2019  Ms. Sample was observed post Covid-19 immunization for 15 minutes without incident. She was provided with Vaccine Information Sheet and instruction to access the V-Safe system.   Ms. Mahaffy was instructed to call 911 with any severe reactions post vaccine: Marland Kitchen Difficulty breathing  . Swelling of face and throat  . A fast heartbeat  . A bad rash all over body  . Dizziness and weakness   Immunizations Administered    Name Date Dose VIS Date Route   Pfizer COVID-19 Vaccine 06/18/2019  9:18 AM 0.3 mL 04/25/2018 Intramuscular   Manufacturer: [employer], [employer]   Lot: [id number]   NDC: [address]-2

## 2019-11-04 IMAGING — US ULTRASOUND RIGHT BREAST LIMITED
1 series · 5 of 5 positions shown · non-contrast
Comparison: Previous exam(s).

CLINICAL DATA: Follow-up probable fibroadenoma in the 11 o'clock
position of the right breast. The patient reports that this has not
changed significantly in size on palpation.

EXAM:
ULTRASOUND OF THE RIGHT BREAST

[Series 1: ultrasound right breast limited · 0.05mm/px · 5 of 5 slices shown]
[im 1/5]
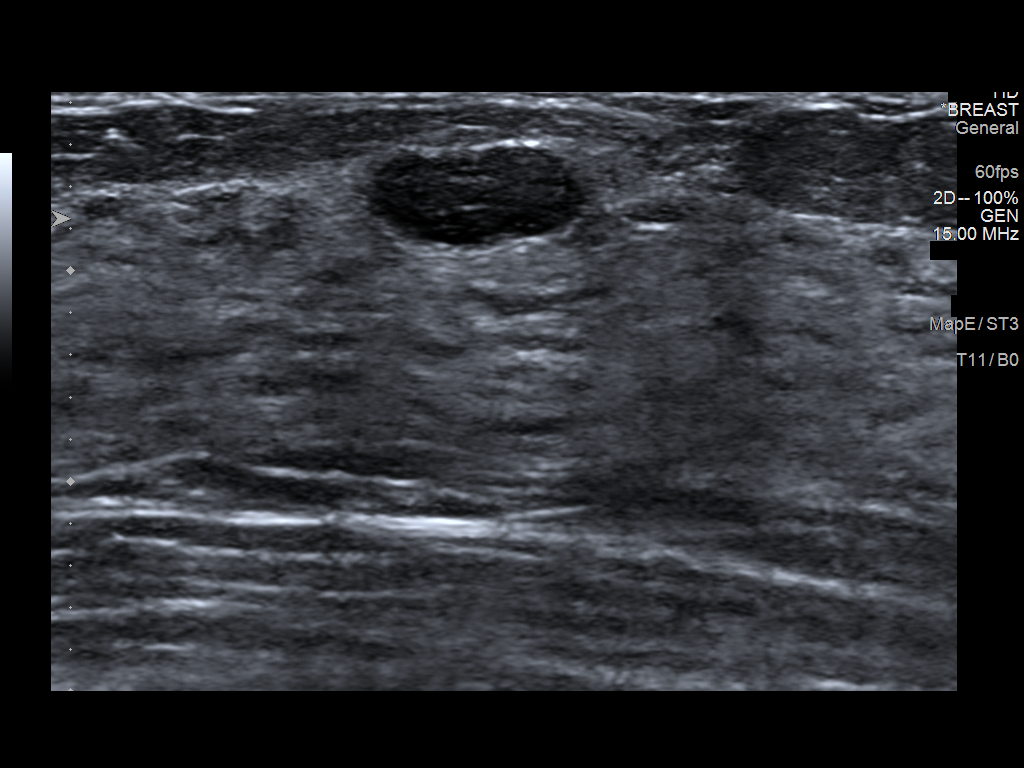
[im 2/5]
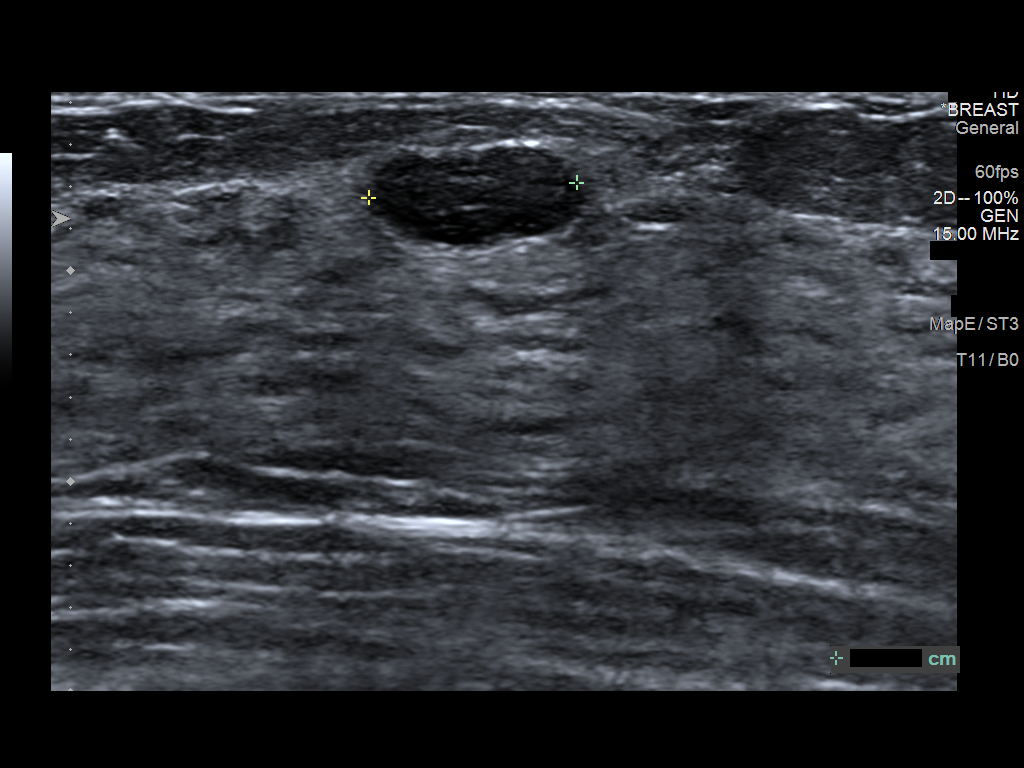
[im 3/5]
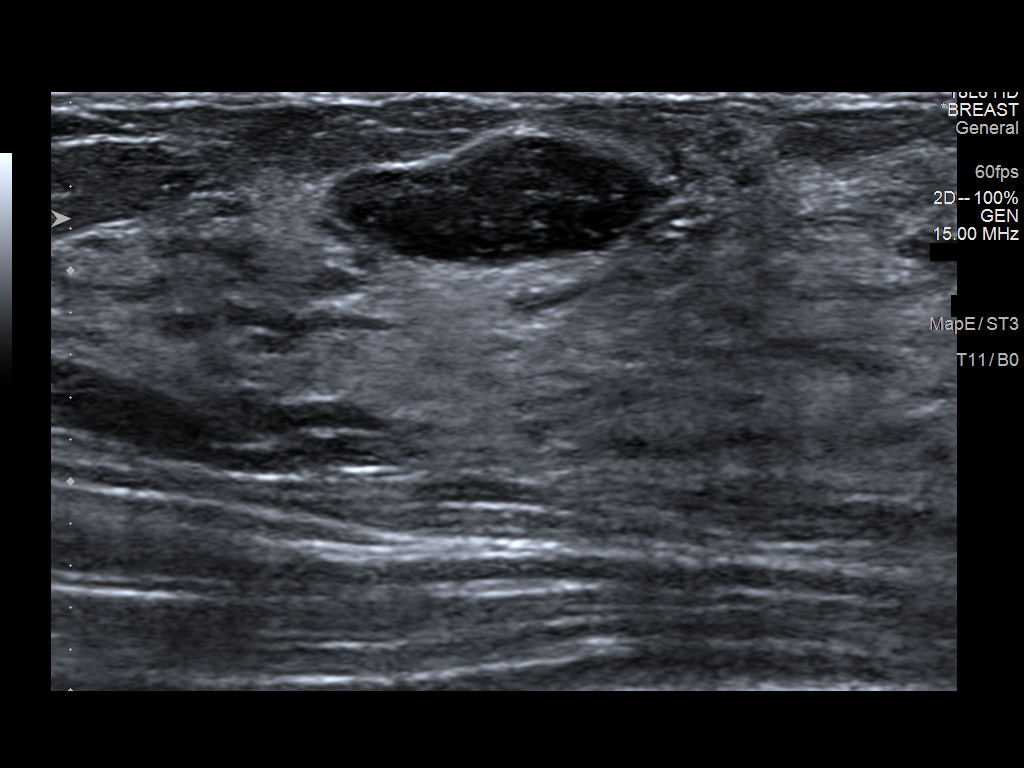
[im 4/5]
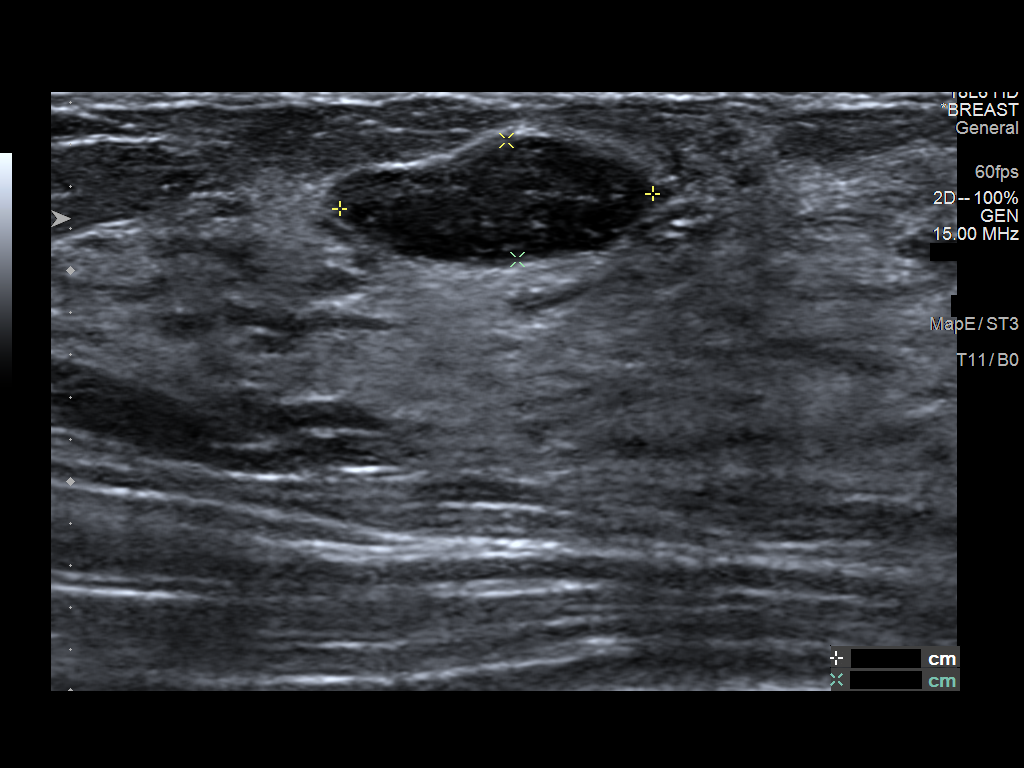
[im 5/5]
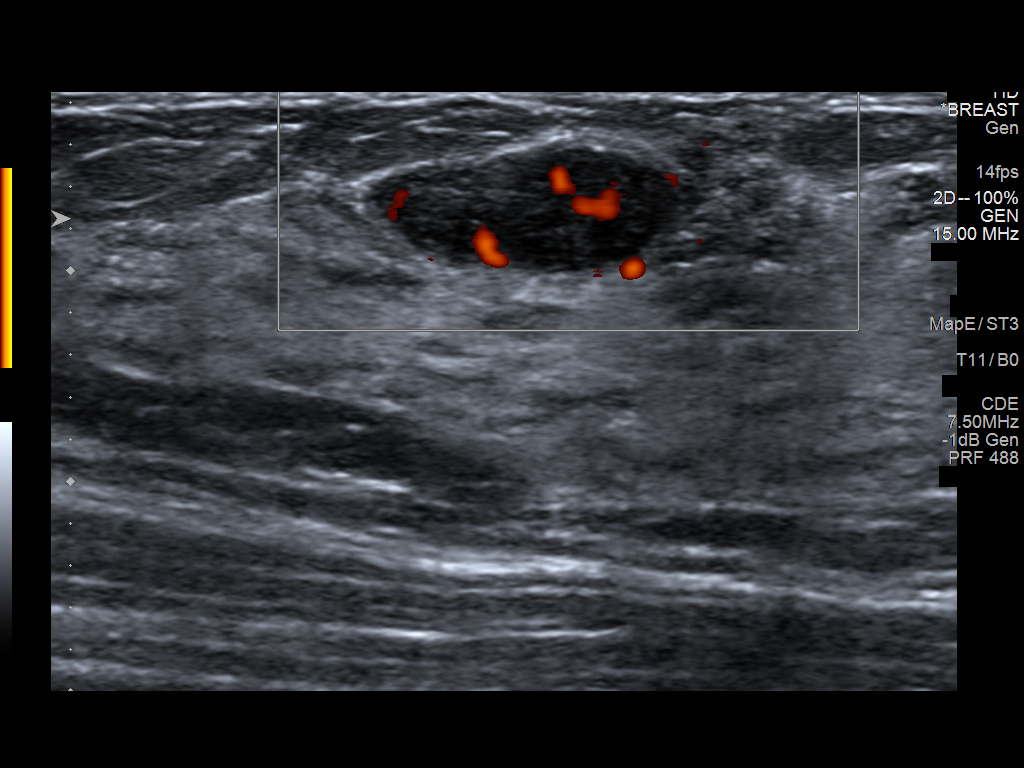

[5 of 5 positions shown; findings below may reference images not displayed]

FINDINGS: Targeted ultrasound is performed, showing an oval, horizontally
oriented, circumscribed mildly hypoechoic mass measuring 1.5 x 1.0 x
0.6 cm in the 11 o'clock position of the right breast, 3 cm from the
nipple. This measured 1.4 x 0.9 x 0.5 cm on 01/04/2017.
IMPRESSION: Very minimal increase in size of a probable fibroadenoma in the 11
o'clock position of the right breast.

RECOMMENDATION:
Right breast ultrasound in 6 months.

I have discussed the findings and recommendations with the patient.
Results were also provided in writing at the conclusion of the
visit. If applicable, a reminder letter will be sent to the patient
regarding the next appointment.

BI-RADS CATEGORY  3: Probably benign.

## 2020-12-03 LAB — OB RESULTS CONSOLE RUBELLA ANTIBODY, IGM: Rubella: NON-IMMUNE/NOT IMMUNE

## 2020-12-03 LAB — OB RESULTS CONSOLE HEPATITIS B SURFACE ANTIGEN: Hepatitis B Surface Ag: NEGATIVE

## 2020-12-03 LAB — OB RESULTS CONSOLE ABO/RH: RH Type: POSITIVE

## 2020-12-03 LAB — OB RESULTS CONSOLE HIV ANTIBODY (ROUTINE TESTING): HIV: NONREACTIVE

## 2020-12-03 LAB — HEPATITIS C ANTIBODY: HCV Ab: NEGATIVE

## 2020-12-03 LAB — OB RESULTS CONSOLE GBS: GBS: POSITIVE

## 2020-12-03 LAB — OB RESULTS CONSOLE ANTIBODY SCREEN: Antibody Screen: NEGATIVE

## 2020-12-16 LAB — OB RESULTS CONSOLE GC/CHLAMYDIA
Chlamydia: NEGATIVE
Gonorrhea: NEGATIVE

## 2021-06-17 IMAGING — US US BREAST*R* LIMITED INC AXILLA
1 series · 7 of 7 positions shown · non-contrast
Comparison: Previous exam(s).

CLINICAL DATA: Patient presents for 2 year follow-up of probably
benign RIGHT breast mass. Patient is currently breast-feeding her
baby born in [REDACTED].

EXAM:
ULTRASOUND OF THE RIGHT BREAST

[Series 1: us breast*right* limited inc axilla · 0.06mm/px · 7 of 7 slices shown]
[im 1/7]
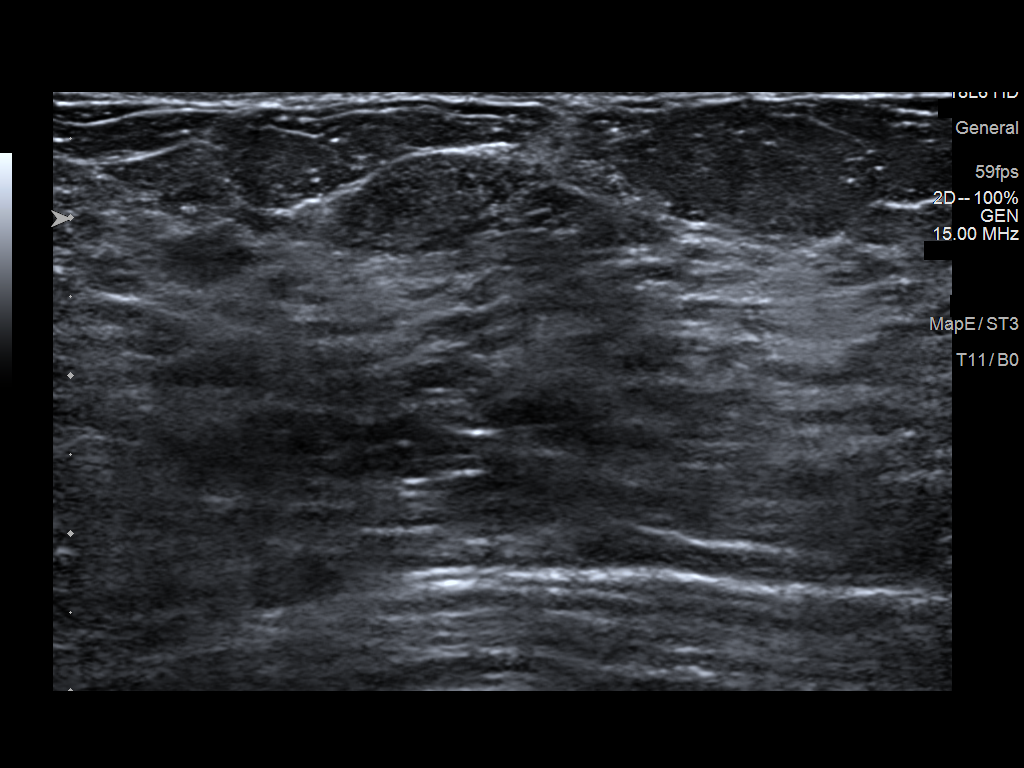
[im 2/7]
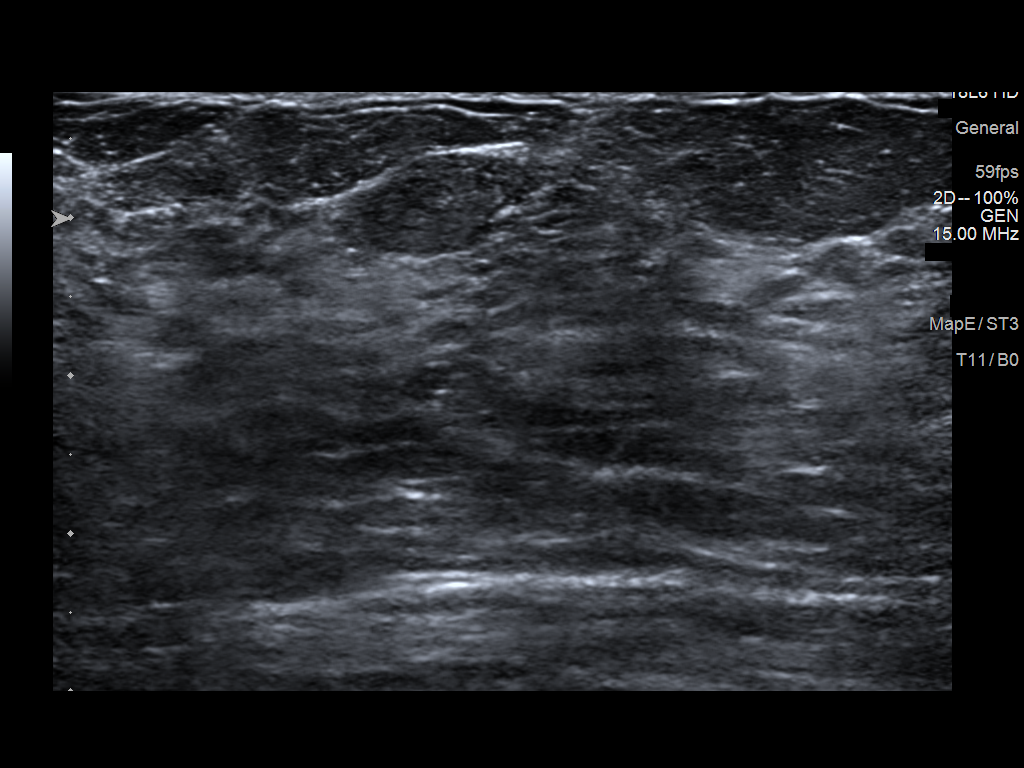
[im 3/7]
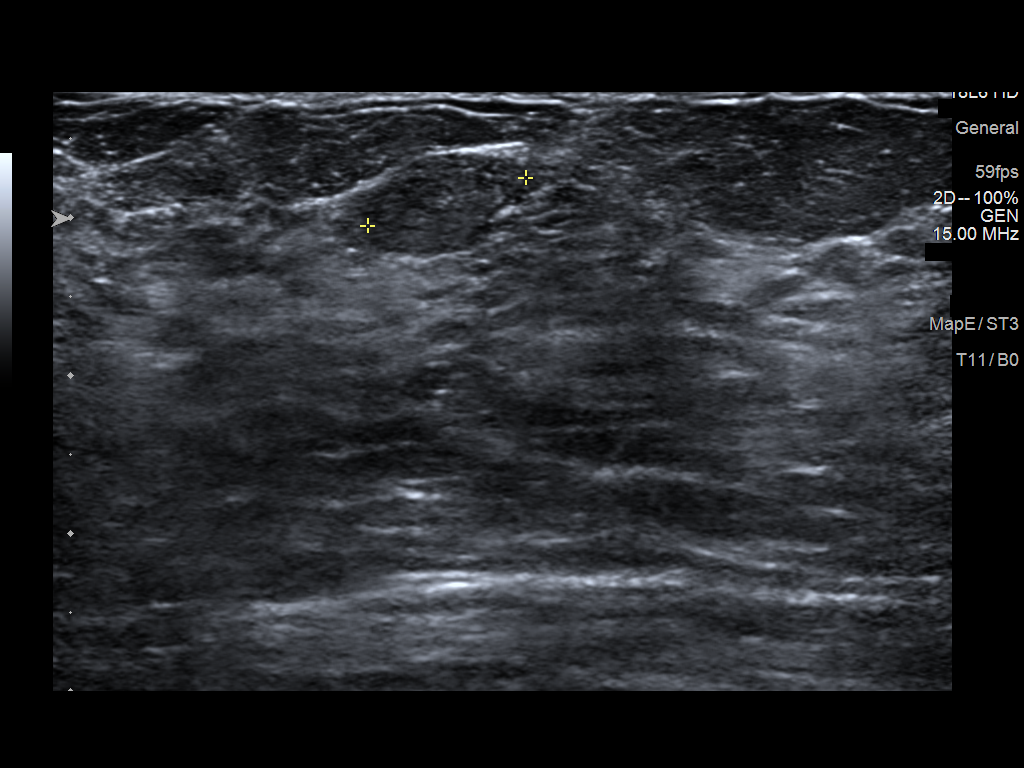
[im 4/7]
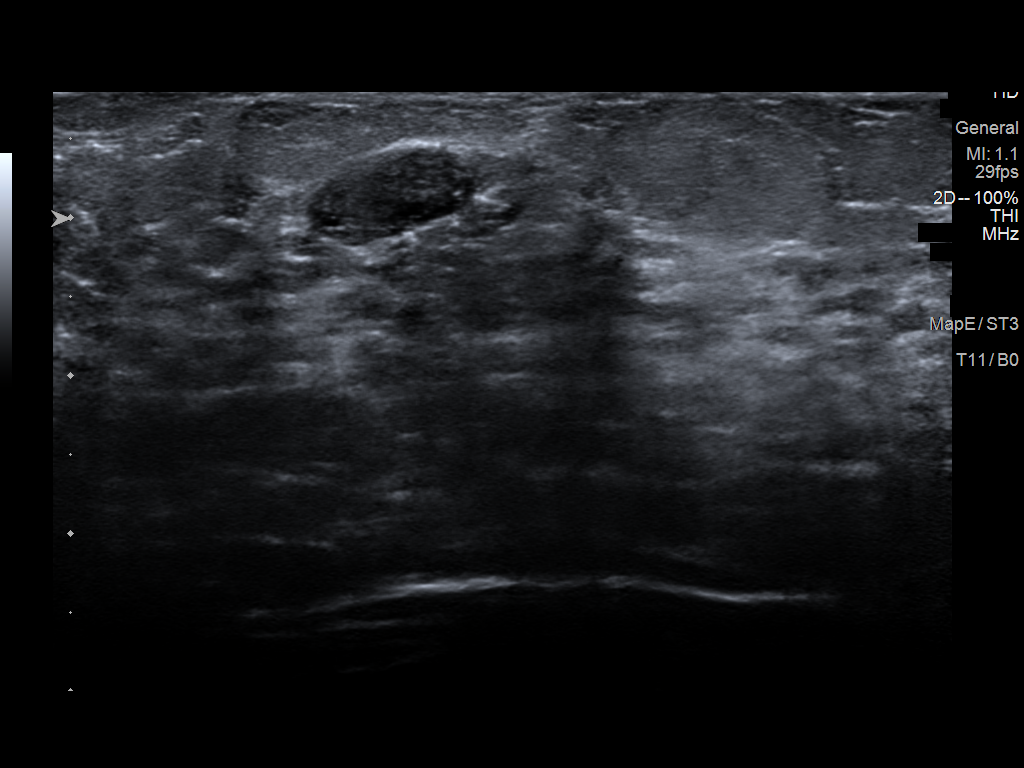
[im 5/7]
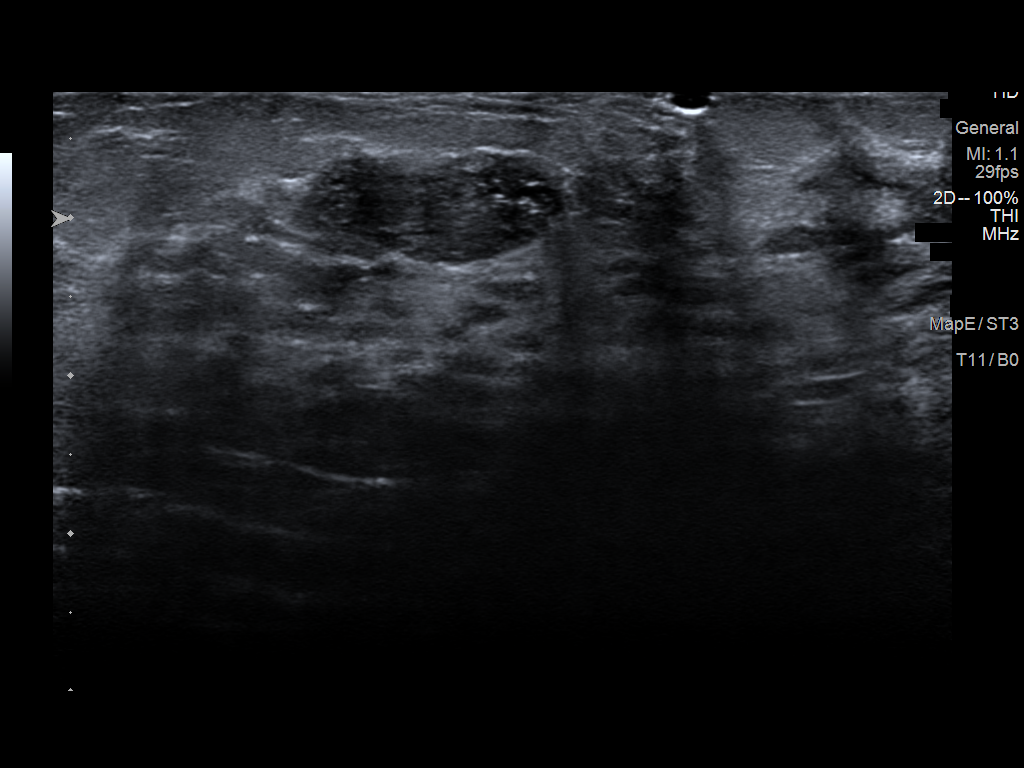
[im 6/7]
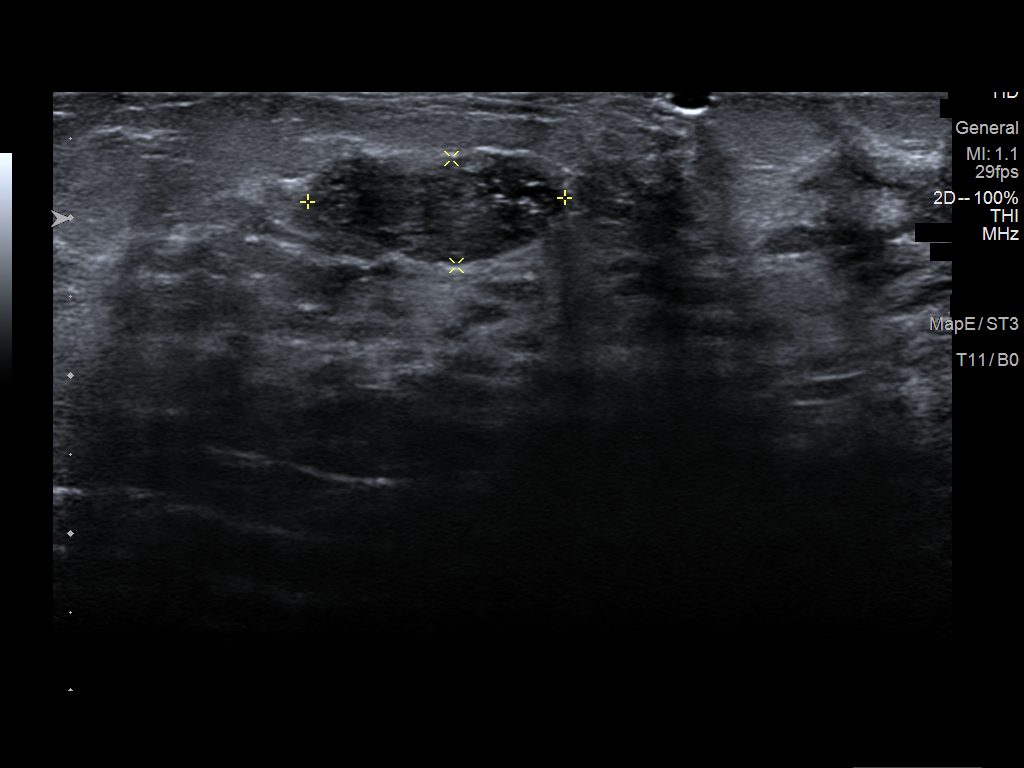
[im 7/7]
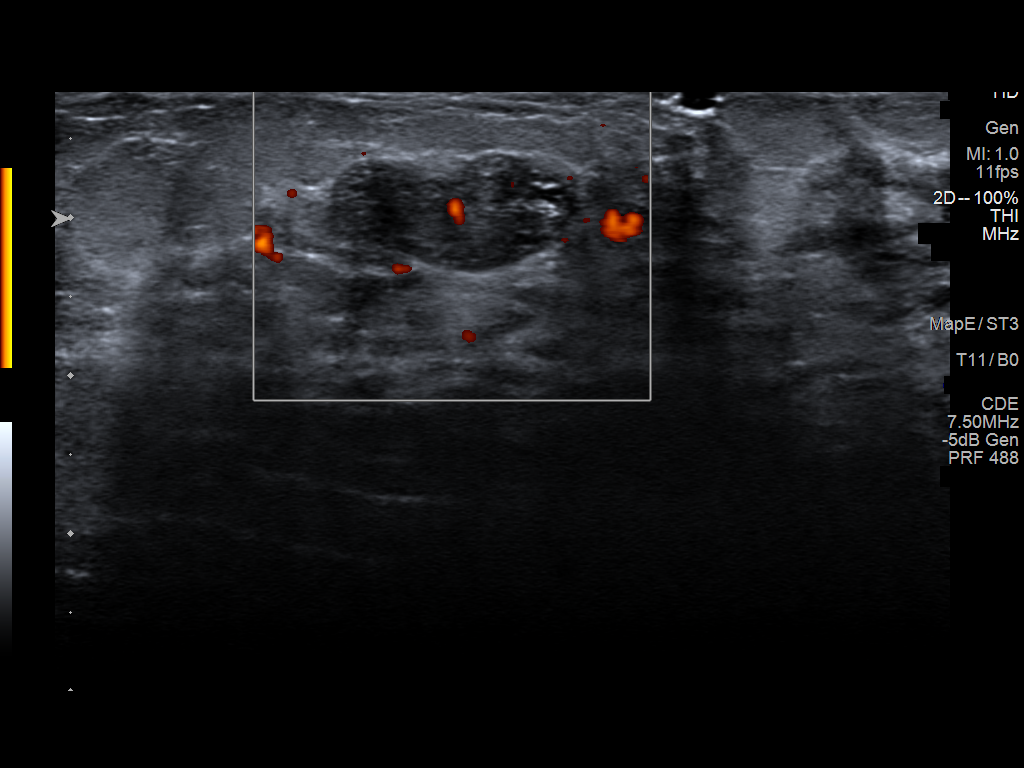

[7 of 7 positions shown; findings below may reference images not displayed]

FINDINGS: Targeted ultrasound is performed, showing a circumscribed oval
hypoechoic parallel mass in the 11 o'clock location of the RIGHT
breast measuring 1.6 x 0.7 x 1.1 centimeters. Mass appears stable in
size since previous study. There are minimal internal cystic changes
likely due to lactational changes. Surrounding breast parenchyma
also shows lactational changes.
IMPRESSION: Stable appearance of fibroadenoma in the RIGHT breast. No further
follow-up is necessary given its long-term stability.

RECOMMENDATION:
Screening mammogram at age 40 unless there are persistent or
intervening clinical concerns. (Code:[id number])

I have discussed the findings and recommendations with the patient.
If applicable, a reminder letter will be sent to the patient
regarding the next appointment.

BI-RADS CATEGORY  2: Benign.

## 2021-06-22 ENCOUNTER — Encounter (HOSPITAL_COMMUNITY): Payer: Self-pay | Admitting: *Deleted

## 2021-06-22 ENCOUNTER — Telehealth (HOSPITAL_COMMUNITY): Payer: Self-pay | Admitting: *Deleted

## 2021-06-22 NOTE — Telephone Encounter (Signed)
Preadmission screen  

## 2021-06-23 ENCOUNTER — Encounter (HOSPITAL_COMMUNITY): Payer: Self-pay | Admitting: *Deleted

## 2021-06-24 ENCOUNTER — Inpatient Hospital Stay (HOSPITAL_COMMUNITY): Payer: BC Managed Care – PPO | Admitting: Anesthesiology

## 2021-06-24 ENCOUNTER — Encounter (HOSPITAL_COMMUNITY): Payer: Self-pay | Admitting: Obstetrics and Gynecology

## 2021-06-24 ENCOUNTER — Inpatient Hospital Stay (HOSPITAL_COMMUNITY)
Admission: AD | Admit: 2021-06-24 | Discharge: 2021-06-25 | DRG: 807 | Disposition: A | Discharge status: Home / Self Care | Payer: BC Managed Care – PPO | Attending: Obstetrics and Gynecology | Admitting: Obstetrics and Gynecology

## 2021-06-24 ENCOUNTER — Other Ambulatory Visit: Payer: Self-pay

## 2021-06-24 DIAGNOSIS — O24424 Gestational diabetes mellitus in childbirth, insulin controlled: Secondary | ICD-10-CM

## 2021-06-24 DIAGNOSIS — Z3A38 38 weeks gestation of pregnancy: Secondary | ICD-10-CM | POA: Diagnosis not present

## 2021-06-24 DIAGNOSIS — O4202 Full-term premature rupture of membranes, onset of labor within 24 hours of rupture: Secondary | ICD-10-CM | POA: Diagnosis not present

## 2021-06-24 DIAGNOSIS — O99824 Streptococcus B carrier state complicating childbirth: Secondary | ICD-10-CM | POA: Diagnosis present

## 2021-06-24 DIAGNOSIS — O26893 Other specified pregnancy related conditions, third trimester: Secondary | ICD-10-CM | POA: Diagnosis present

## 2021-06-24 DIAGNOSIS — Z3490 Encounter for supervision of normal pregnancy, unspecified, unspecified trimester: Principal | ICD-10-CM

## 2021-06-24 DIAGNOSIS — O24425 Gestational diabetes mellitus in childbirth, controlled by oral hypoglycemic drugs: Principal | ICD-10-CM | POA: Diagnosis present

## 2021-06-24 HISTORY — DX: Type 2 diabetes mellitus without complications: E11.9

## 2021-06-24 LAB — GLUCOSE, CAPILLARY
Glucose-Capillary: 78 mg/dL (ref 70–99)
Glucose-Capillary: 75 mg/dL (ref 70–99)

## 2021-06-24 LAB — CBC
HCT: 35.9 % — ABNORMAL LOW (ref 36.0–46.0)
Hemoglobin: 12 g/dL (ref 12.0–15.0)
MCH: 31.3 pg (ref 26.0–34.0)
MCHC: 33.4 g/dL (ref 30.0–36.0)
MCV: 93.5 fL (ref 80.0–100.0)
Platelets: 198 10*3/uL (ref 150–400)
RBC: 3.84 MIL/uL — ABNORMAL LOW (ref 3.87–5.11)
RDW: 13.3 % (ref 11.5–15.5)
WBC: 9.9 10*3/uL (ref 4.0–10.5)
nRBC: 0 % (ref 0.0–0.2)

## 2021-06-24 LAB — TYPE AND SCREEN
ABO/RH(D): O POS
Antibody Screen: NEGATIVE

## 2021-06-24 LAB — POCT FERN TEST: POCT Fern Test: POSITIVE

## 2021-06-24 MED ORDER — ACETAMINOPHEN 325 MG PO TABS: 650.0000 mg | ORAL_TABLET | ORAL | Status: DC | PRN

## 2021-06-24 MED ORDER — SENNOSIDES-DOCUSATE SODIUM 8.6-50 MG PO TABS
2.0000 | ORAL_TABLET | ORAL | Status: DC
  Administered 2021-06-24: 2 via ORAL
  Filled 2021-06-24: qty 2

## 2021-06-24 MED ORDER — TETANUS-DIPHTH-ACELL PERTUSSIS 5-2.5-18.5 LF-MCG/0.5 IM SUSY: 0.5000 mL | PREFILLED_SYRINGE | Freq: Once | INTRAMUSCULAR | Status: DC

## 2021-06-24 MED ORDER — LACTATED RINGERS IV SOLN
500.0000 mL | Freq: Once | INTRAVENOUS | Status: AC
  Administered 2021-06-24: 500 mL via INTRAVENOUS

## 2021-06-24 MED ORDER — EPHEDRINE 5 MG/ML INJ: 10.0000 mg | INTRAVENOUS | Status: DC | PRN

## 2021-06-24 MED ORDER — OXYCODONE HCL 5 MG PO TABS: 10.0000 mg | ORAL_TABLET | ORAL | Status: DC | PRN

## 2021-06-24 MED ORDER — FENTANYL-BUPIVACAINE-NACL 0.5-0.125-0.9 MG/250ML-% EP SOLN
12.0000 mL/h | EPIDURAL | Status: DC | PRN
  Administered 2021-06-24: 12 mL/h via EPIDURAL
  Filled 2021-06-24: qty 250

## 2021-06-24 MED ORDER — LIDOCAINE HCL (PF) 1 % IJ SOLN
INTRAMUSCULAR | Status: DC | PRN
Start: 2021-06-24 — End: 2021-06-24
  Administered 2021-06-24: 8 mL via EPIDURAL

## 2021-06-24 MED ORDER — LIDOCAINE HCL (PF) 1 % IJ SOLN
30.0000 mL | INTRAMUSCULAR | Status: AC | PRN
  Administered 2021-06-24: 30 mL via SUBCUTANEOUS
  Filled 2021-06-24: qty 30

## 2021-06-24 MED ORDER — TERBUTALINE SULFATE 1 MG/ML IJ SOLN: 0.2500 mg | Freq: Once | INTRAMUSCULAR | Status: DC | PRN

## 2021-06-24 MED ORDER — OXYCODONE-ACETAMINOPHEN 5-325 MG PO TABS: 1.0000 | ORAL_TABLET | ORAL | Status: DC | PRN

## 2021-06-24 MED ORDER — PENICILLIN G POT IN DEXTROSE 60000 UNIT/ML IV SOLN
3.0000 10*6.[IU] | INTRAVENOUS | Status: DC
  Administered 2021-06-24: 3 10*6.[IU] via INTRAVENOUS
  Filled 2021-06-24: qty 50

## 2021-06-24 MED ORDER — ESCITALOPRAM OXALATE 20 MG PO TABS
20.0000 mg | ORAL_TABLET | Freq: Every day | ORAL | Status: DC
  Filled 2021-06-24: qty 1

## 2021-06-24 MED ORDER — FLEET ENEMA 7-19 GM/118ML RE ENEM: 1.0000 | ENEMA | RECTAL | Status: DC | PRN

## 2021-06-24 MED ORDER — FENTANYL CITRATE (PF) 100 MCG/2ML IJ SOLN: 50.0000 ug | INTRAMUSCULAR | Status: DC | PRN

## 2021-06-24 MED ORDER — DIPHENHYDRAMINE HCL 25 MG PO CAPS: 25.0000 mg | ORAL_CAPSULE | Freq: Four times a day (QID) | ORAL | Status: DC | PRN

## 2021-06-24 MED ORDER — OXYCODONE-ACETAMINOPHEN 5-325 MG PO TABS: 2.0000 | ORAL_TABLET | ORAL | Status: DC | PRN

## 2021-06-24 MED ORDER — OXYTOCIN BOLUS FROM INFUSION
333.0000 mL | Freq: Once | INTRAVENOUS | Status: AC
  Administered 2021-06-24: 333 mL via INTRAVENOUS

## 2021-06-24 MED ORDER — LACTATED RINGERS IV SOLN: 500.0000 mL | INTRAVENOUS | Status: DC | PRN

## 2021-06-24 MED ORDER — DIPHENHYDRAMINE HCL 50 MG/ML IJ SOLN: 12.5000 mg | INTRAMUSCULAR | Status: DC | PRN

## 2021-06-24 MED ORDER — SOD CITRATE-CITRIC ACID 500-334 MG/5ML PO SOLN: 30.0000 mL | ORAL | Status: DC | PRN

## 2021-06-24 MED ORDER — LACTATED RINGERS IV SOLN: INTRAVENOUS | Status: DC

## 2021-06-24 MED ORDER — PHENYLEPHRINE 80 MCG/ML (10ML) SYRINGE FOR IV PUSH (FOR BLOOD PRESSURE SUPPORT): 80.0000 ug | PREFILLED_SYRINGE | INTRAVENOUS | Status: DC | PRN

## 2021-06-24 MED ORDER — OXYTOCIN-SODIUM CHLORIDE 30-0.9 UT/500ML-% IV SOLN: 2.5000 [IU]/h | INTRAVENOUS | Status: DC

## 2021-06-24 MED ORDER — ZOLPIDEM TARTRATE 5 MG PO TABS: 5.0000 mg | ORAL_TABLET | Freq: Every evening | ORAL | Status: DC | PRN

## 2021-06-24 MED ORDER — SIMETHICONE 80 MG PO CHEW: 80.0000 mg | CHEWABLE_TABLET | ORAL | Status: DC | PRN

## 2021-06-24 MED ORDER — OXYTOCIN-SODIUM CHLORIDE 30-0.9 UT/500ML-% IV SOLN
1.0000 m[IU]/min | INTRAVENOUS | Status: DC
  Administered 2021-06-24: 2 m[IU]/min via INTRAVENOUS
  Filled 2021-06-24: qty 500

## 2021-06-24 MED ORDER — SODIUM CHLORIDE 0.9 % IV SOLN
5.0000 10*6.[IU] | Freq: Once | INTRAVENOUS | Status: AC
  Administered 2021-06-24: 5 10*6.[IU] via INTRAVENOUS
  Filled 2021-06-24: qty 5

## 2021-06-24 MED ORDER — OXYCODONE HCL 5 MG PO TABS: 5.0000 mg | ORAL_TABLET | ORAL | Status: DC | PRN

## 2021-06-24 MED ORDER — PRENATAL MULTIVITAMIN CH
1.0000 | ORAL_TABLET | Freq: Every day | ORAL | Status: DC
  Administered 2021-06-25: 1 via ORAL
  Filled 2021-06-24: qty 1

## 2021-06-24 MED ORDER — ONDANSETRON HCL 4 MG/2ML IJ SOLN: 4.0000 mg | INTRAMUSCULAR | Status: DC | PRN

## 2021-06-24 MED ORDER — ONDANSETRON HCL 4 MG/2ML IJ SOLN: 4.0000 mg | Freq: Four times a day (QID) | INTRAMUSCULAR | Status: DC | PRN

## 2021-06-24 MED ORDER — IBUPROFEN 600 MG PO TABS
600.0000 mg | ORAL_TABLET | Freq: Four times a day (QID) | ORAL | Status: DC
  Administered 2021-06-25 (×4): 600 mg via ORAL
  Filled 2021-06-24 (×4): qty 1

## 2021-06-24 MED ORDER — BENZOCAINE-MENTHOL 20-0.5 % EX AERO: 1.0000 "application " | INHALATION_SPRAY | CUTANEOUS | Status: DC | PRN

## 2021-06-24 MED ORDER — COCONUT OIL OIL: 1.0000 "application " | TOPICAL_OIL | Status: DC | PRN

## 2021-06-24 MED ORDER — ONDANSETRON HCL 4 MG PO TABS: 4.0000 mg | ORAL_TABLET | ORAL | Status: DC | PRN

## 2021-06-24 MED ORDER — DIBUCAINE (PERIANAL) 1 % EX OINT: 1.0000 "application " | TOPICAL_OINTMENT | CUTANEOUS | Status: DC | PRN

## 2021-06-24 MED ORDER — WITCH HAZEL-GLYCERIN EX PADS: 1.0000 "application " | MEDICATED_PAD | CUTANEOUS | Status: DC | PRN

## 2021-06-24 NOTE — Progress Notes (Signed)
Delivery Note ?At 4:53 PM a viable female was delivered via Vaginal, Spontaneous (Presentation:   LOA   ).  APGAR: , ; weight  .   ?Placenta status: intact ,  .  Cord:   3 vessels with the following complications:  .  Cord pH:  ? ?Anesthesia: Epidural ?Episiotomy:   ?Lacerations: bilat periurethral lacs, first degree not repaired on right, second degree repaired on left. Second degree ML lac repaired ?Suture Repair: 2.0 vicryl rapide ?Est. Blood Loss (mL):  150 ? ?Mom to postpartum.  Baby to Couplet care / Skin to Skin. ? ?Roselle Locus II ?06/24/2021, 5:09 PM ? ?

## 2021-06-24 NOTE — Anesthesia Procedure Notes (Signed)
Epidural ?Patient location during procedure: OB ?Start time: 06/24/2021 4:03 PM ?End time: 06/24/2021 4:13 PM ? ?Staffing ?Anesthesiologist: Merlinda Frederick, MD ?Performed: anesthesiologist  ? ?Preanesthetic Checklist ?Completed: patient identified, IV checked, site marked, risks and benefits discussed, monitors and equipment checked, pre-op evaluation and timeout performed ? ?Epidural ?Patient position: sitting ?Prep: DuraPrep ?Patient monitoring: heart rate, cardiac monitor, continuous pulse ox and blood pressure ?Approach: midline ?Location: L2-L3 ?Injection technique: LOR saline ? ?Needle:  ?Needle type: Tuohy  ?Needle gauge: 17 G ?Needle length: 9 cm ?Needle insertion depth: 5 cm ?Catheter type: closed end flexible ?Catheter size: 20 Guage ?Catheter at skin depth: 10 cm ?Test dose: negative and Other ? ?Assessment ?Events: blood not aspirated, injection not painful, no injection resistance and negative IV test ? ?Additional Notes ?Informed consent obtained prior to proceeding including risk of failure, 1% risk of PDPH, risk of minor discomfort and bruising.  Discussed rare but serious complications including epidural abscess, permanent nerve injury, epidural hematoma.  Discussed alternatives to epidural analgesia and patient desires to proceed.  Timeout performed pre-procedure verifying patient name, procedure, and platelet count.  Patient tolerated procedure well. ? ? ? ? ?

## 2021-06-24 NOTE — Lactation Note (Signed)
This note was copied from a baby's chart. ?Lactation Consultation Note ? ?Patient Name: Ashley Carson ?Today's Date: 06/24/2021 ?Reason for consult: L&D Initial assessment;Early term 37-38.6wks;Maternal endocrine disorder ?Age:28 hours ? ?Visited with mom of < 1 hour old ETI female, she's a P2 and experienced breastfeeding. Assisted with latch and hand expression, baby able to latch easily but required constant repositioning to sustain the latch. Ms. Hubbs has great tissue and she tried both positions cross cradle and football, showed her how to do a deep latch. Reviewed normal newborn behavior, cluster feeding, feeding cues, size of baby's stomach and expectations. ? ?Maternal Data ?Has patient been taught Hand Expression?: Yes ?Does the patient have breastfeeding experience prior to this delivery?: Yes ?How long did the patient breastfeed?: 27 months ? ?Feeding ?Mother's Current Feeding Choice: Breast Milk ? ?LATCH Score ?Latch: Repeated attempts needed to sustain latch, nipple held in mouth throughout feeding, stimulation needed to elicit sucking reflex. ? ?Audible Swallowing: A few with stimulation ? ?Type of Nipple: Everted at rest and after stimulation ? ?Comfort (Breast/Nipple): Soft / non-tender ? ?Hold (Positioning): Assistance needed to correctly position infant at breast and maintain latch. ? ?LATCH Score: 7 ? ?Interventions ?Interventions: Skin to skin;Education;Breast compression;Adjust position;Breast massage;Hand express;Support pillows;Position options ? ?Plan of care ?Encouraged mom to put baby to breast 8-12 times/24 hours or sooner if feeding cues are present ?Hand expression and spoon feeding were also encouraged ? ?FOB present. All questions and concerns answered, family to contact [facility] services PRN. ? ?Discharge ?Pump: Personal (Spectra S1 and Elvie) ? ?Consult Status ?Consult Status: Follow-up from L&D ?Date: 06/24/21 ?Follow-up type: In-patient ? ? ?Cherene Dobbins S Camie Hauss ?06/24/2021, 5:28 PM ? ? ? ?

## 2021-06-24 NOTE — Progress Notes (Signed)
Cephalic by BS[facility]. 

## 2021-06-24 NOTE — MAU Note (Signed)
Ashley Carson is a 26 y.o. at [redacted]w[redacted]d here in MAU reporting: SROM at 0645, clear fluid with a light pink tint, fluid is still coming. No active bleeding.  Some contractions- not regular or painful.  Report GDM and +GBS.  Was 2+/50 on Monday ? ?Onset of complaint: 045 ?Pain score: none ?There were no vitals filed for this visit.   ? ?Lab orders placed from triage: none  ?

## 2021-06-24 NOTE — Anesthesia Preprocedure Evaluation (Signed)
Anesthesia Evaluation  ?Patient identified by MRN, date of birth, ID band ?Patient awake ? ? ? ?Reviewed: ?Allergy & Precautions, NPO status , Patient's Chart, lab work & pertinent test results ? ?Airway ?Mallampati: II ? ?TM Distance: >3 FB ?Neck ROM: Full ? ? ? Dental ?no notable dental hx. ? ?  ?Pulmonary ?neg pulmonary ROS,  ?  ?Pulmonary exam normal ?breath sounds clear to auscultation ? ? ? ? ? ? Cardiovascular ?hypertension, negative cardio ROS ?Normal cardiovascular exam ?Rhythm:Regular Rate:Normal ? ? ?  ?Neuro/Psych ?PSYCHIATRIC DISORDERS Anxiety negative neurological ROS ?   ? GI/Hepatic ?negative GI ROS, Neg liver ROS,   ?Endo/Other  ?negative endocrine ROSdiabetes, Gestational ? Renal/GU ?negative Renal ROS  ?negative genitourinary ?  ?Musculoskeletal ?negative musculoskeletal ROS ?(+)  ? Abdominal ?  ?Peds ?negative pediatric ROS ?(+)  Hematology ?negative hematology ROS ?(+)   ?Anesthesia Other Findings ? ? Reproductive/Obstetrics ?(+) Pregnancy ? ?  ? ? ? ? ? ? ? ? ? ? ? ? ? ?  ?  ? ? ? ? ? ? ? ? ?Anesthesia Physical ?Anesthesia Plan ? ?ASA: 2 ? ?Anesthesia Plan: Epidural  ? ?Post-op Pain Management:   ? ?Induction:  ? ?PONV Risk Score and Plan: 2 and Treatment may vary due to age or medical condition ? ?Airway Management Planned: Natural Airway ? ?Additional Equipment: None ? ?Intra-op Plan:  ? ?Post-operative Plan:  ? ?Informed Consent: I have reviewed the patients History and Physical, chart, labs and discussed the procedure including the risks, benefits and alternatives for the proposed anesthesia with the patient or authorized representative who has indicated his/her understanding and acceptance.  ? ? ? ? ? ?Plan Discussed with: Anesthesiologist ? ?Anesthesia Plan Comments:   ? ? ? ? ? ? ?Anesthesia Quick Evaluation ? ?

## 2021-06-24 NOTE — H&P (Signed)
Ashley Carson is a 26 y.o. female presenting for SROM about 6:45 am today. Feeling some UCs. 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 Office U/S @ 37 2/7 noted EFW 6# 13oz (50%). 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 ?OB History   ? ? Gravida  ?2  ? Para  ?1  ? Term  ?1  ? Preterm  ?   ? AB  ?   ? Living  ?1  ?  ? ? SAB  ?   ? IAB  ?   ? Ectopic  ?   ? Multiple  ?0  ? Live Births  ?1  ?   ?  ?  ? ?Past Medical History:  ?Diagnosis Date  ? Anxiety   ? Cough 04/23/2011  ? Diabetes mellitus without complication (HCC)   ? gdm  ? Family history of adverse reaction to anesthesia   ? mother N&V  ? Patellar tendon rupture 04/2011  ? right  ? Pregnancy induced hypertension 2020  ? with first pregnancy  ? ?Past Surgical History:  ?Procedure Laterality Date  ? KNEE ARTHROSCOPY  07/09/2010  ? right; open repair patella dislocation  ? MEDIAL PATELLOFEMORAL LIGAMENT REPAIR  04/29/2011  ? Procedure: MEDIAL PATELLA FEMORAL LIGAMENT RECONSTRUCTION;  Surgeon: 05/01/2011, MD;  Location: [facility];  Service: Orthopedics;  Laterality: Right;  right knee arthroscopy with chondroplasty open medial patella femoral ligament repair  ? RHINOPLASTY    ? ?Family History: family history includes Anesthesia problems in her mother; Hypertension in her maternal grandmother. ?Social History:  reports that she has never smoked. She has never used smokeless tobacco. She reports that she does not drink alcohol and does not use drugs. ? ? ?  ?Maternal Diabetes: Yes:  Diabetes Type:  Insulin/Medication controlled ?Genetic Screening: Normal ?Maternal Ultrasounds/Referrals: Normal ?Fetal Ultrasounds or other Referrals:  None ?Maternal Substance Abuse:  No ?Significant Maternal Medications:  None ?Significant Maternal Lab Results:  Group B Strep positive ?Other Comments:  None ? ?Review of Systems  ?Constitutional:  Negative for fever.  ?Eyes:  Negative for visual disturbance.  ?Neurological:  Negative for  headaches.  ?Maternal Medical History:  ?Reason for admission: Rupture of membranes.  ? ?Fetal activity: Perceived fetal activity is normal.   ? ?  ?Blood pressure 136/72, pulse 73, temperature 98.3 ?F (36.8 ?C), temperature source Oral, resp. rate 16, height 5\' 7"  (1.702 m), weight 83 kg, unknown if currently breastfeeding. ?Maternal Exam:  ?Abdomen: Fetal presentation: vertex ? ? ?Fetal Exam ?Fetal State Assessment: Category I - tracings are normal. ? ?Physical Exam ?Cardiovascular:  ?   Rate and Rhythm: Normal rate.  ?Pulmonary:  ?   Effort: Pulmonary effort is normal.  ?  ?VTX by [facility] per CNM ? ?Prenatal labs: ?ABO, Rh: O/Positive/-- (10/05 0000) ?Antibody: Negative (10/05 0000) ?Rubella: Nonimmune (10/05 0000) ?RPR:    ?HBsAg: Negative (10/05 0000)  ?HIV: Non-reactive (10/05 0000)  ?GBS: Positive/-- (10/05 0000)  ? ?Assessment/Plan: ?26 yo G2P1 @ 38 1/7 wks ?SROM ?GBBS positive>Atb ordered  ? ?34 II ?06/24/2021, 9:46 AM ? ? ? ? ?

## 2021-06-25 LAB — CBC
HCT: 32.8 % — ABNORMAL LOW (ref 36.0–46.0)
Hemoglobin: 10.7 g/dL — ABNORMAL LOW (ref 12.0–15.0)
MCH: 30.7 pg (ref 26.0–34.0)
MCHC: 32.6 g/dL (ref 30.0–36.0)
MCV: 94 fL (ref 80.0–100.0)
Platelets: 178 10*3/uL (ref 150–400)
RBC: 3.49 MIL/uL — ABNORMAL LOW (ref 3.87–5.11)
RDW: 13.2 % (ref 11.5–15.5)
WBC: 11.4 10*3/uL — ABNORMAL HIGH (ref 4.0–10.5)
nRBC: 0 % (ref 0.0–0.2)

## 2021-06-25 LAB — RPR: RPR Ser Ql: NONREACTIVE

## 2021-06-25 MED ORDER — IBUPROFEN 600 MG PO TABS: 600.0000 mg | ORAL_TABLET | Freq: Four times a day (QID) | ORAL | 0 refills | Status: AC

## 2021-06-25 NOTE — Social Work (Signed)
MOB was referred for history of Anxiety. ?* Referral screened out by Clinical Social Worker because: ?* MOB's symptoms currently being treated with medication. ? ?Please contact the Clinical Social Worker if needs arise, by MOB request, or if MOB scores greater than 9/yes to question 10 on Edinburgh Postpartum Depression Screen. ? ?Tearah Saulsbury MSW, LCSWA  ?Float CSW ?

## 2021-06-25 NOTE — Progress Notes (Signed)
Post Partum Day 1 ?Subjective: ?no complaints, up ad lib, voiding, and tolerating PO.  Patient desires circ but has photo session scheduled this AM. ? ?Objective: ?Blood pressure 113/76, pulse 73, temperature 98.2 ?F (36.8 ?C), temperature source Oral, resp. rate 16, height 5\' 7"  (1.702 m), weight 83 kg, SpO2 98 %, unknown if currently breastfeeding. ? ?Physical Exam:  ?General: alert, cooperative, and appears stated age ?Lochia: appropriate ?Uterine Fundus: firm ?Incision: healing well, no significant drainage, no dehiscence ?DVT Evaluation: No evidence of DVT seen on physical exam. ?Negative Homan's sign. ?No cords or calf tenderness. ? ?Recent Labs  ?  06/24/21 ?0857 06/25/21 ?0543  ?HGB 12.0 10.7*  ?HCT 35.9* 32.8*  ? ? ?Assessment/Plan: ?Discharge home, Breastfeeding, and Circumcision prior to discharge ?Circ-patient is counseled re: risk of bleeding, infection and scarring.  All questions were answered and patient wishes to proceed. ? ? LOS: 1 day  ? ?Jinny Blossom Mistee Soliman ?06/25/2021, 9:10 AM  ? ? ?

## 2021-06-25 NOTE — Lactation Note (Signed)
This note was copied from a baby's chart. ?Lactation Consultation Note ? ?Patient Name: Ashley Carson ?Today's Date: 06/25/2021 ?Reason for consult: Follow-up assessment;Mother's request;Difficult latch;Early term 37-38.6wks;Breastfeeding assistance ?Age:28 hours ? ?LC assisted with latching infant in cross cradle prone to get more depth on the breast. Infant recent feeding 14 ml of EBM and was not interested. Mom to call for latch assistance with next feeding.  ? ?Mom nipple trauma from pumping right more than left. Flange size adjusted, Mom using EBM and coconut oil for nipple care.  ? ?Plan 1. To feed based on cues 8-12x 24hr period. Mom to latch and look for signs of milk transfer.  ?2. Mom to supplement with EBM 7-12 ml per feeding with slow flow nipple and pace bottle feeding. Mom aware to offer more if not able to latch.  ?3. DEBP q 3 hrs for 15 min  ?All questions answered at the end of the visit.  ? ?Maternal Data ?Has patient been taught Hand Expression?: Yes ? ?Feeding ?Mother's Current Feeding Choice: Breast Milk ? ?LATCH Score ?  ? ?  ? ?  ? ?  ? ?  ? ?  ? ? ?Lactation Tools Discussed/Used ?Tools: Pump;Flanges;Coconut oil (Mom nipple trauma using 24 flange. LC applied coconut oil and increased flange size to 27, states comfortable fit. Mom aware with pumping flange size can change to adjust accordingly.) ?Flange Size: 27 ?Breast pump type: Double-Electric Breast Pump ?Pump Education: Setup, frequency, and cleaning;Milk Storage ?Reason for Pumping: increase stimulation ?Pumping frequency: every 3 hrs for 15 min ? ?Interventions ?Interventions: Breast feeding basics reviewed;Assisted with latch;Skin to skin;Breast massage;Hand express;Breast compression;Adjust position;Coconut oil;DEBP;Education;Pace feeding;Infant Driven Feeding Algorithm education ? ?Discharge ?  ? ?Consult Status ?Consult Status: Follow-up ?Date: 06/26/21 ?Follow-up type: In-patient ? ? ? ?Deyna Carbon  Nicholson-Springer ?06/25/2021, 4:10  PM ? ? ? ?

## 2021-06-25 NOTE — Anesthesia Postprocedure Evaluation (Signed)
Anesthesia Post Note ? ?Patient: San Jetty ? ?Procedure(s) Performed: AN AD HOC LABOR EPIDURAL ? ?  ? ?Patient location during evaluation: Mother Baby ?Anesthesia Type: Epidural ?Level of consciousness: awake, oriented and awake and alert ?Pain management: pain level controlled ?Vital Signs Assessment: post-procedure vital signs reviewed and stable ?Respiratory status: spontaneous breathing, respiratory function stable and nonlabored ventilation ?Cardiovascular status: stable ?Postop Assessment: no headache, adequate PO intake, able to ambulate, patient able to bend at knees and no apparent nausea or vomiting ?Anesthetic complications: no ? ? ?No notable events documented. ? ?Last Vitals:  ?Vitals:  ? 06/25/21 0300 06/25/21 0727  ?BP: 111/78 113/76  ?Pulse: (!) 50 73  ?Resp: 16 16  ?Temp: 36.5 ?C 36.8 ?C  ?SpO2:  98%  ?  ?Last Pain:  ?Vitals:  ? 06/25/21 0727  ?TempSrc: Oral  ?PainSc: 0-No pain  ? ?Pain Goal:   ? ?  ?  ?  ?  ?  ?  ?  ? ?Jamicia Haaland ? ? ? ? ?

## 2021-06-25 NOTE — Lactation Note (Signed)
This note was copied from a baby's chart. ?Lactation Consultation Note ? ?Patient Name: Ashley Carson ?Today's Date: 06/25/2021 ?Reason for consult: Follow-up assessment;Early term 37-38.6wks ?Age:28 hours ? ?Mom says infant is latching well. She has also been hand expressing and spoon-feeding. Mom was provided a measuring cup so amount spoon-fed to infant can be accurately reported. Mom was also provided a Foley cup. I discussed how to use cup/spoon for feeding infant.  ? ?Mom was made aware of O/P services, breastfeeding support groups, and our phone # for post-discharge questions. Mom has no other questions for me at this time.  ? ?Interventions ?Interventions: Education;Hand express;LC Services brochure ? ? ?[name] ?06/25/2021, 11:12 AM ? ? ? ?

## 2021-06-25 NOTE — Discharge Instructions (Signed)
Call MD for T>100.4, heavy vaginal bleeding, severe abdominal pain, or respiratory distress.  Call office to schedule postpartum visit in 6 weeks.  Pelvic rest x 6 weeks.   

## 2021-06-25 NOTE — Discharge Summary (Signed)
? ?  Postpartum Discharge Summary ? ?Patient Name: Ashley Carson ?DOB: 11/15/1995 ?MRN: [id number] ? ?Date of admission: 06/24/2021 ?Delivery date:06/24/2021  ?Delivering provider: TOMBLIN, JAMES  ?Date of discharge: 06/25/2021 ? ?Admitting diagnosis: Term pregnancy [Z34.90] ?Intrauterine pregnancy: [redacted]w[redacted]d     ?Secondary diagnosis:  Principal Problem: ?  Term pregnancy ? ?Additional problems: A2DM    ?Discharge diagnosis: Term Pregnancy Delivered                                              ?Post partum procedures: none ?Augmentation: Pitocin ?Complications: None ? ?Hospital course: Onset of Labor With Vaginal Delivery      ?26 y.o. yo G2P2002 at [redacted]w[redacted]d was admitted in Active Labor on 06/24/2021. Patient had an uncomplicated labor course as follows:  ?Membrane Rupture Time/Date: 6:45 AM ,06/24/2021   ?Delivery Method:Vaginal, Spontaneous  ?Episiotomy: None  ?Lacerations:  2nd degree;Periurethral  ?Patient had an uncomplicated postpartum course.  She is ambulating, tolerating a regular diet, passing flatus, and urinating well. Patient is discharged home in stable condition on 06/25/21. ? ?Newborn Data: ?Birth date:06/24/2021  ?Birth time:4:53 PM  ?Gender:Female  ?Living status:Living  ?Apgars:8 ,9  ?Weight:3310 g  ? ?Magnesium Sulfate received: No ?BMZ received: No ?Rhophylac:No ?MMR:No ?T-DaP:Given prenatally ?Flu: No ?Transfusion:No ? ?Physical exam  ?Vitals:  ? 06/24/21 1930 06/24/21 2317 06/25/21 0300 06/25/21 0727  ?BP: 116/72 (!) 110/58 111/78 113/76  ?Pulse: (!) 57 72 (!) 50 73  ?Resp: 16 18 16 16  ?Temp: 98.3 ?F (36.8 ?C) 98.8 ?F (37.1 ?C) 97.7 ?F (36.5 ?C) 98.2 ?F (36.8 ?C)  ?TempSrc: Oral Oral Oral Oral  ?SpO2:    98%  ?Weight:      ?Height:      ? ?General: alert, cooperative, and no distress ?Lochia: appropriate ?Uterine Fundus: firm ?Incision: Healing well with no significant drainage, No significant erythema ?DVT Evaluation: No evidence of DVT seen on physical exam. ?Negative Homan's sign. ?No cords or calf  tenderness. ?Labs: ?Lab Results  ?Component Value Date  ? WBC 11.4 (H) 06/25/2021  ? HGB 10.7 (L) 06/25/2021  ? HCT 32.8 (L) 06/25/2021  ? MCV 94.0 06/25/2021  ? PLT 178 06/25/2021  ? ? ?  Latest Ref Rng & Units 03/23/2018  ? 11:57 PM  ?CMP  ?Glucose 70 - 99 mg/dL 128    ?BUN 6 - 20 mg/dL 21    ?Creatinine 0.44 - 1.00 mg/dL 0.56    ?Sodium 135 - 145 mmol/L 132    ?Potassium 3.5 - 5.1 mmol/L 3.6    ?Chloride 98 - 111 mmol/L 101    ?CO2 22 - 32 mmol/L 21    ?Calcium 8.9 - 10.3 mg/dL 8.9    ?Total Protein 6.5 - 8.1 g/dL 7.7    ?Total Bilirubin 0.3 - 1.2 mg/dL 1.0    ?Alkaline Phos 38 - 126 U/L 52    ?AST 15 - 41 U/L 21    ?ALT 0 - 44 U/L 20    ? ?Edinburgh Score: ? ?  11/25/2018  ?  5:32 PM  ?Edinburgh Postnatal Depression Scale Screening Tool  ?I have been able to laugh and see the funny side of things. 0  ?I have looked forward with enjoyment to things. 0  ?I have blamed myself unnecessarily when things went wrong. 1  ?I have been anxious or worried for no good reason. 0  ?  I have felt scared or panicky for no good reason. 0  ?Things have been getting on top of me. 0  ?I have been so unhappy that I have had difficulty sleeping. 0  ?I have felt sad or miserable. 0  ?I have been so unhappy that I have been crying. 0  ?The thought of harming myself has occurred to me. 0  ?Edinburgh Postnatal Depression Scale Total 1  ? ? ? ?After visit meds:  ?Allergies as of 06/25/2021   ?No Known Allergies ?  ? ?  ?Medication List  ?  ? ?STOP taking these medications   ? ?glyBURIDE 2.5 MG tablet ?Commonly known as: DIABETA ?  ? ?  ? ?TAKE these medications   ? ?escitalopram 20 MG tablet ?Commonly known as: LEXAPRO ?Take 20 mg by mouth daily. ?  ?ibuprofen 600 MG tablet ?Commonly known as: ADVIL ?Take 1 tablet (600 mg total) by mouth every 6 (six) hours. ?  ?multivitamin-prenatal 27-0.8 MG Tabs tablet ?Take 1 tablet by mouth daily at 12 noon. ?  ? ?  ? ? ? ?Discharge home in stable condition ?Infant Feeding: Breast ?Infant Disposition:home  with mother ?Discharge instruction: per After Visit Summary and Postpartum booklet. ?Activity: Advance as tolerated. Pelvic rest for 6 weeks.  ?Diet: routine diet ?Future Appointments:No future appointments. ?Follow up Visit: 6 weeks PPV ? ? ?06/25/2021 ?Linda Hedges, DO ? ? ? ?

## 2021-06-25 NOTE — Lactation Note (Signed)
This note was copied from a baby's chart. ?Lactation Consultation Note ? ?Patient Name: Ashley Carson ?Today's Date: 06/25/2021 ?  ?Age:28 hours ? ? [place] visit attempted, but staff member and family members were in room. Mom requested that I return at a later time. ? ?Larkin Ina ?06/25/2021, 9:07 AM ? ? ? ?

## 2021-06-30 ENCOUNTER — Inpatient Hospital Stay (HOSPITAL_COMMUNITY)
Admission: AD | Admit: 2021-06-30 | Payer: BC Managed Care – PPO | Source: Home / Self Care | Admitting: Obstetrics & Gynecology

## 2021-06-30 ENCOUNTER — Inpatient Hospital Stay (HOSPITAL_COMMUNITY): Payer: BC Managed Care – PPO

## 2021-07-03 ENCOUNTER — Telehealth (HOSPITAL_COMMUNITY): Payer: Self-pay | Admitting: *Deleted

## 2021-07-03 NOTE — Telephone Encounter (Signed)
Mom reports feeling good. No concerns about herself at this time. EPDS=2 [facility] score=2) ?Mom reports baby is doing well. Feeding, peeing, and pooping without difficulty. Safe sleep reviewed. Mom reports no concerns about baby at present. ? ?Duffy Rhody, RN 07-03-2021 at 11:27am ?
